# Patient Record
Sex: Male | Born: 1960 | Race: White | Hispanic: No | Marital: Married | Smoking: Never smoker
Health system: Southern US, Community
[De-identification: ages and names within clinical notes are randomized; demographics above are authoritative.]

## PROBLEM LIST (undated history)

## (undated) DIAGNOSIS — I639 Cerebral infarction, unspecified: Secondary | ICD-10-CM

## (undated) HISTORY — PX: CORONARY ARTERY BYPASS GRAFT: SHX141

---

## 2010-01-02 ENCOUNTER — Ambulatory Visit: Payer: Self-pay | Admitting: Occupational Medicine

## 2010-01-02 DIAGNOSIS — J309 Allergic rhinitis, unspecified: Secondary | ICD-10-CM | POA: Insufficient documentation

## 2010-01-02 DIAGNOSIS — J45909 Unspecified asthma, uncomplicated: Secondary | ICD-10-CM | POA: Insufficient documentation

## 2010-12-18 NOTE — Assessment & Plan Note (Signed)
Summary: Sinus pressure x 6 week s rm 2   Vital Signs:  Patient Profile:   50 Years Old Male CC:      Cold & URI symptoms Height:     68 inches Weight:      178 pounds O2 Sat:      97 % O2 treatment:    Room Air Temp:     96.7 degrees F oral Pulse rate:   59 / minute Pulse rhythm:   regular Resp:     16 per minute BP sitting:   165 / 111  (right arm) Cuff size:   regular  Vitals Entered By: Areta Haber CMA (January 02, 2010 7:25 PM)                  Current Allergies: No known allergies History of Present Illness Chief Complaint: Cold & URI symptoms History of Present Illness: Presents with complaints of sinus congestion for the last 6 weeks.   He gets increased facial pressure when bending over.   No complaints of teeth hurting.  No cough.    History of allergies and asthma.     No reports of fever.   He takes zyrtec as needed.   He has not taken any lately.   No nasal steroids.   No itchy eyes.  Some sneezing.     Afrin nasal spray at night helps.     Current Problems: ASTHMA (ICD-493.90) ALLERGIC RHINITIS (ICD-477.9) ALLERGIC RHINITIS (ICD-477.9)   Current Meds ADVAIR HFA 45-21 MCG/ACT AERO (FLUTICASONE-SALMETEROL) 1 puff two times a day PROVENTIL HFA 108 (90 BASE) MCG/ACT AERS (ALBUTEROL SULFATE) as needed AFRIN NASAL SPRAY 0.05 % SOLN (OXYMETAZOLINE HCL) As needed ZYRTEC HIVES RELIEF 10 MG TABS (CETIRIZINE HCL) as directed FLUTICASONE PROPIONATE 50 MCG/ACT SUSP (FLUTICASONE PROPIONATE) 2 sprays in each nostril once daily  REVIEW OF SYSTEMS Constitutional Symptoms      Denies fever, chills, night sweats, weight loss, weight gain, and fatigue.  Eyes       Denies change in vision, eye pain, eye discharge, glasses, contact lenses, and eye surgery. Ear/Nose/Throat/Mouth       Complains of sinus problems.      Denies hearing loss/aids, change in hearing, ear pain, ear discharge, dizziness, frequent runny nose, frequent nose bleeds, sore throat, hoarseness, and  tooth pain or bleeding.      Comments: nasal congestion x 6weeks Respiratory       Denies dry cough, productive cough, wheezing, shortness of breath, asthma, bronchitis, and emphysema/COPD.  Cardiovascular       Denies murmurs, chest pain, and tires easily with exhertion.    Gastrointestinal       Denies stomach pain, nausea/vomiting, diarrhea, constipation, blood in bowel movements, and indigestion. Genitourniary       Denies painful urination, kidney stones, and loss of urinary control. Neurological       Denies paralysis, seizures, and fainting/blackouts. Musculoskeletal       Denies muscle pain, joint pain, joint stiffness, decreased range of motion, redness, swelling, muscle weakness, and gout.  Skin       Denies bruising, unusual mles/lumps or sores, and hair/skin or nail changes.  Psych       Denies mood changes, temper/anger issues, anxiety/stress, speech problems, depression, and sleep problems.  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  Family History: Parkinsons Heart Surgery Family History Liver disease  Social History: Married Never Smoked Alcohol use-no Drug use-no  Regular exercise-no Smoking Status:  never Does Patient Exercise:  no Drug Use:  no Physical Exam General appearance: well developed, well nourished, no acute distress Ears: normal, no lesions or deformities Nasal: swollen red turbinates with congestion Oral/Pharynx: tongue normal, posterior pharynx without erythema or exudate Neck: neck supple,  trachea midline, no masses Chest/Lungs: no rales, wheezes, or rhonchi bilateral, breath sounds equal without effort Heart: regular rate and  rhythm, no murmur Assessment New Problems: ASTHMA (ICD-493.90) ALLERGIC RHINITIS (ICD-477.9) ALLERGIC RHINITIS (ICD-477.9)   Plan New Medications/Changes: FLUTICASONE PROPIONATE 50 MCG/ACT SUSP (FLUTICASONE PROPIONATE) 2 sprays in each  nostril once daily  #One x 1, 01/02/2010, Kathrine Haddock MD  New Orders: New Patient Level III [phone] Planning Comments:   Flonase as directed Use claritin D or Zyrtedc D Over the counter as directed on the box regularly Neti pot saline nasal rinses Follow up with Dr. Cathey Endow or Linford Arnold if no improvement in 2 weeks.   The patient and/or caregiver has been counseled thoroughly with regard to medications prescribed including dosage, schedule, interactions, rationale for use, and possible side effects and they verbalize understanding.  Diagnoses and expected course of recovery discussed and will return if not improved as expected or if the condition worsens. Patient and/or caregiver verbalized understanding.  Prescriptions: FLUTICASONE PROPIONATE 50 MCG/ACT SUSP (FLUTICASONE PROPIONATE) 2 sprays in each nostril once daily  #One x 1   Entered and Authorized by:   Kathrine Haddock MD   Signed by:   Kathrine Haddock MD on 01/02/2010   Method used:   Print then Give to Patient   RxID:   [id number]

## 2011-07-10 ENCOUNTER — Inpatient Hospital Stay (INDEPENDENT_AMBULATORY_CARE_PROVIDER_SITE_OTHER)
Admission: RE | Admit: 2011-07-10 | Discharge: 2011-07-10 | Disposition: A | Discharge status: Home / Self Care | Payer: BC Managed Care – PPO | Source: Ambulatory Visit | Attending: Family Medicine | Admitting: Family Medicine

## 2011-07-10 ENCOUNTER — Encounter: Payer: Self-pay | Admitting: Family Medicine

## 2011-07-10 DIAGNOSIS — J45909 Unspecified asthma, uncomplicated: Secondary | ICD-10-CM

## 2011-07-10 DIAGNOSIS — J309 Allergic rhinitis, unspecified: Secondary | ICD-10-CM

## 2011-07-10 DIAGNOSIS — J050 Acute obstructive laryngitis [croup]: Secondary | ICD-10-CM | POA: Insufficient documentation

## 2011-07-10 DIAGNOSIS — J069 Acute upper respiratory infection, unspecified: Secondary | ICD-10-CM

## 2011-07-14 ENCOUNTER — Telehealth (INDEPENDENT_AMBULATORY_CARE_PROVIDER_SITE_OTHER): Payer: Self-pay

## 2011-09-16 DIAGNOSIS — I2510 Atherosclerotic heart disease of native coronary artery without angina pectoris: Secondary | ICD-10-CM

## 2011-10-21 NOTE — Progress Notes (Signed)
Summary: COUGH...WSE(RM4)   Vital Signs:  Patient Profile:   50 Years Old Male CC:      Cough Height:     68 inches Weight:      169 pounds O2 Sat:      99 % O2 treatment:    Room Air Temp:     98.8 degrees F oral Pulse rate:   62 / minute Resp:     18 per minute BP sitting:   159 / 89  (left arm) Cuff size:   regular  Vitals Entered By: Linton Flemings RN (July 10, 2011 7:45 PM)                  Prior Medication List:  ADVAIR HFA 45-21 MCG/ACT AERO (FLUTICASONE-SALMETEROL) 1 puff two times a day PROVENTIL HFA 108 (90 BASE) MCG/ACT AERS (ALBUTEROL SULFATE) as needed AFRIN NASAL SPRAY 0.05 % SOLN (OXYMETAZOLINE HCL) As needed ZYRTEC HIVES RELIEF 10 MG TABS (CETIRIZINE HCL) as directed FLUTICASONE PROPIONATE 50 MCG/ACT SUSP (FLUTICASONE PROPIONATE) 2 sprays in each nostril once daily   Updated Prior Medication List: ADVAIR HFA 45-21 MCG/ACT AERO (FLUTICASONE-SALMETEROL) 1 puff two times a day PROVENTIL HFA 108 (90 BASE) MCG/ACT AERS (ALBUTEROL SULFATE) as needed  Current Allergies (reviewed today): ! * HAY FEVERHistory of Present Illness Chief Complaint: Cough History of Present Illness: Patient reports irritation of his throat for 2 weeks. He reports being horse and having a recurrsent ticle that sometimes seems if his throat was going to close over. he reports using his advair discus as prescribed twice aday and has used his rescue inhaler a couple of times w/out improvement. He does not smoke,is not exoposed to 2nd hand smoke and rarely drinks at all.  Current Problems: UPPER RESPIRATORY INFECTION, ACUTE (ICD-465.9) LARYNGOTRACHEOBRONCHITIS, ACUTE (ICD-464.4) ASTHMA (ICD-493.90) ALLERGIC RHINITIS (ICD-477.9)   Current Meds ADVAIR HFA 45-21 MCG/ACT AERO (FLUTICASONE-SALMETEROL) 1 puff two times a day PROVENTIL HFA 108 (90 BASE) MCG/ACT AERS (ALBUTEROL SULFATE) as needed ZITHROMAX Z-PAK 250 MG  TABS (AZITHROMYCIN) Use as directed TUSSIONEX PENNKINETIC ER 8-10  MG/5ML LQCR (CHLORPHENIRAMINE-HYDROCODONE) 1/2 to 1 tsp by mouth twice aday  REVIEW OF SYSTEMS Constitutional Symptoms      Denies fever, chills, night sweats, weight loss, weight gain, and fatigue.  Eyes       Complains of glasses.      Denies change in vision, eye pain, eye discharge, contact lenses, and eye surgery. Ear/Nose/Throat/Mouth       Complains of ear pain and hoarseness.      Denies hearing loss/aids, change in hearing, ear discharge, dizziness, frequent runny nose, frequent nose bleeds, sinus problems, sore throat, and tooth pain or bleeding.  Respiratory       Complains of dry cough and asthma.      Denies productive cough, wheezing, shortness of breath, bronchitis, and emphysema/COPD.  Cardiovascular       Denies murmurs, chest pain, and tires easily with exhertion.    Gastrointestinal       Denies stomach pain, nausea/vomiting, diarrhea, constipation, blood in bowel movements, and indigestion. Genitourniary       Denies painful urination, kidney stones, and loss of urinary control. Neurological       Denies paralysis, seizures, and fainting/blackouts. Musculoskeletal       Denies muscle pain, joint pain, joint stiffness, decreased range of motion, redness, swelling, muscle weakness, and gout.  Skin       Denies bruising, unusual mles/lumps or sores, and hair/skin or nail changes.  Psych  Denies mood changes, temper/anger issues, anxiety/stress, speech problems, depression, and sleep problems. Other Comments: X 2 WEEKS DRY COUGH ,STATES HE FEELS A TICKLE IN THE BACK OF  THE THROAT   Past History:  Family History: Last updated: 01/02/2010 Parkinsons Heart Surgery Family History Liver disease  Social History: Last updated: 01/02/2010 Married Never Smoked Alcohol use-no Drug use-no Regular exercise-no  Risk Factors: Exercise: no (01/02/2010)  Risk Factors: Smoking Status: never (01/02/2010)  Past Medical History: Reviewed history from 01/02/2010  and no changes required. Allergic rhinitis Asthma  Past Surgical History: Reviewed history from 01/02/2010 and no changes required. 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 Parkinsons Heart Surgery Family History Liver disease  Social History: Reviewed history from 01/02/2010 and no changes required. Married Never Smoked Alcohol use-no Drug use-no Regular exercise-no Physical Exam General appearance: well developed, well nourished, no acute distress Head: normocephalic, atraumatic Ears: normal, no lesions or deformities Oral/Pharynx: tongue normal, posterior pharynx without erythema or exudate Neck: supple,anterior lymphadenopathy present Chest/Lungs: no rales, wheezes, or rhonchi bilateral, breath sounds equal without effort Heart: regular rate and  rhythm, no murmur Skin: no obvious rashes or lesions MSE: oriented to time, place, and person Assessment New Problems: UPPER RESPIRATORY INFECTION, ACUTE (ICD-465.9) LARYNGOTRACHEOBRONCHITIS, ACUTE (ICD-464.4)   Patient Education: Patient and/or caregiver instructed in the following: rest fluids and Tylenol.  Plan New Medications/Changes: Sandria Senter ER 8-10 MG/5ML LQCR (CHLORPHENIRAMINE-HYDROCODONE) 1/2 to 1 tsp by mouth twice aday  #40fl oz x 0, 07/10/2011, Hassan Rowan MD ZITHROMAX Z-PAK 250 MG  TABS (AZITHROMYCIN) Use as directed  #1 x 0, 07/10/2011, Hassan Rowan MD  New Orders: New Patient Level III [phone] Planning Comments:   stress the importance of follow up w/ENT for possible indirect laryngoscopy.  Considered getting a CXR today butX-ray technician left already.  Follow Up: Follow up in 2-3 days if no improvement, Follow up on an as needed basis, Follow up with Primary Physician  The patient and/or caregiver has been counseled thoroughly with regard to medications prescribed including dosage, schedule, interactions, rationale for use, and  possible side effects and they verbalize understanding.  Diagnoses and expected course of recovery discussed and will return if not improved as expected or if the condition worsens. Patient and/or caregiver verbalized understanding.  Prescriptions: Sandria Senter ER 8-10 MG/5ML LQCR (CHLORPHENIRAMINE-HYDROCODONE) 1/2 to 1 tsp by mouth twice aday  #83fl oz x 0   Entered and Authorized by:   Hassan Rowan MD   Signed by:   Hassan Rowan MD on 07/10/2011   Method used:   Printed then faxed to ...       [facility]  [address] (retail)       [address]       [place], [place]  [address]       Ph: [phone]       Fax: [phone]   RxID:   [id number] ZITHROMAX Z-PAK 250 MG  TABS (AZITHROMYCIN) Use as directed  #1 x 0   Entered and Authorized by:   Hassan Rowan MD   Signed by:   Hassan Rowan MD on 07/10/2011   Method used:   Printed then faxed to ...       [facility]  [address] (retail)       [address]       [place], [place]  [address]       Ph: [phone]  Fax: [phone]   RxID:   [id number]   Patient Instructions: 1)  Please schedule a follow-up appointment as needed. 2)  Please schedule an appointment with your primary doctor in :7 -10 days or ENT if not completetly resolved 3)  May need CXR or ENT follow up 4)  Take your antibiotic as prescribed until ALL of it is gone, but stop if you develop a rash or swelling and contact our office as soon as possible.  Orders Added: 1)  New Patient Level III [30865]

## 2011-10-21 NOTE — Telephone Encounter (Signed)
  Phone Note Outgoing Call   Call placed by: Linton Flemings RN,  July 14, 2011 4:30 PM Call placed to: Patient Summary of Call: F/U CALL, NO ANSWER. MSG LEFT TO CALL FACILITY WITH QUESTIONS/CONCERNS

## 2019-11-04 DIAGNOSIS — J0110 Acute frontal sinusitis, unspecified: Secondary | ICD-10-CM | POA: Diagnosis not present

## 2020-07-30 DIAGNOSIS — Z20822 Contact with and (suspected) exposure to covid-19: Secondary | ICD-10-CM | POA: Diagnosis not present

## 2020-07-30 DIAGNOSIS — J45909 Unspecified asthma, uncomplicated: Secondary | ICD-10-CM | POA: Diagnosis not present

## 2021-12-20 DIAGNOSIS — I252 Old myocardial infarction: Secondary | ICD-10-CM | POA: Diagnosis not present

## 2021-12-20 DIAGNOSIS — Z91199 Patient's noncompliance with other medical treatment and regimen due to unspecified reason: Secondary | ICD-10-CM | POA: Diagnosis not present

## 2021-12-20 DIAGNOSIS — I2109 ST elevation (STEMI) myocardial infarction involving other coronary artery of anterior wall: Secondary | ICD-10-CM | POA: Diagnosis not present

## 2021-12-20 DIAGNOSIS — I10 Essential (primary) hypertension: Secondary | ICD-10-CM | POA: Diagnosis not present

## 2021-12-20 DIAGNOSIS — I498 Other specified cardiac arrhythmias: Secondary | ICD-10-CM | POA: Diagnosis not present

## 2021-12-20 DIAGNOSIS — Z955 Presence of coronary angioplasty implant and graft: Secondary | ICD-10-CM | POA: Diagnosis not present

## 2021-12-20 DIAGNOSIS — I358 Other nonrheumatic aortic valve disorders: Secondary | ICD-10-CM | POA: Diagnosis not present

## 2021-12-20 DIAGNOSIS — I2583 Coronary atherosclerosis due to lipid rich plaque: Secondary | ICD-10-CM | POA: Diagnosis not present

## 2021-12-20 DIAGNOSIS — T82855A Stenosis of coronary artery stent, initial encounter: Secondary | ICD-10-CM | POA: Diagnosis not present

## 2021-12-20 DIAGNOSIS — Z79899 Other long term (current) drug therapy: Secondary | ICD-10-CM | POA: Diagnosis not present

## 2021-12-20 DIAGNOSIS — I959 Hypotension, unspecified: Secondary | ICD-10-CM | POA: Diagnosis not present

## 2021-12-20 DIAGNOSIS — I2510 Atherosclerotic heart disease of native coronary artery without angina pectoris: Secondary | ICD-10-CM | POA: Diagnosis not present

## 2021-12-20 DIAGNOSIS — R001 Bradycardia, unspecified: Secondary | ICD-10-CM | POA: Diagnosis not present

## 2021-12-20 DIAGNOSIS — I213 ST elevation (STEMI) myocardial infarction of unspecified site: Secondary | ICD-10-CM | POA: Diagnosis not present

## 2021-12-20 DIAGNOSIS — Z95818 Presence of other cardiac implants and grafts: Secondary | ICD-10-CM | POA: Diagnosis not present

## 2021-12-20 DIAGNOSIS — R9431 Abnormal electrocardiogram [ECG] [EKG]: Secondary | ICD-10-CM | POA: Diagnosis not present

## 2021-12-20 DIAGNOSIS — T82867A Thrombosis of cardiac prosthetic devices, implants and grafts, initial encounter: Secondary | ICD-10-CM | POA: Diagnosis not present

## 2021-12-20 DIAGNOSIS — E785 Hyperlipidemia, unspecified: Secondary | ICD-10-CM | POA: Diagnosis not present

## 2021-12-20 DIAGNOSIS — Z7982 Long term (current) use of aspirin: Secondary | ICD-10-CM | POA: Diagnosis not present

## 2021-12-20 DIAGNOSIS — I2584 Coronary atherosclerosis due to calcified coronary lesion: Secondary | ICD-10-CM | POA: Diagnosis not present

## 2021-12-20 DIAGNOSIS — I493 Ventricular premature depolarization: Secondary | ICD-10-CM | POA: Diagnosis not present

## 2021-12-20 DIAGNOSIS — Z9114 Patient's other noncompliance with medication regimen: Secondary | ICD-10-CM | POA: Diagnosis not present

## 2022-01-16 DIAGNOSIS — B349 Viral infection, unspecified: Secondary | ICD-10-CM | POA: Diagnosis not present

## 2022-02-04 DIAGNOSIS — I2510 Atherosclerotic heart disease of native coronary artery without angina pectoris: Secondary | ICD-10-CM | POA: Diagnosis not present

## 2022-02-04 DIAGNOSIS — I252 Old myocardial infarction: Secondary | ICD-10-CM | POA: Diagnosis not present

## 2022-02-04 DIAGNOSIS — E782 Mixed hyperlipidemia: Secondary | ICD-10-CM | POA: Diagnosis not present

## 2022-02-04 DIAGNOSIS — I10 Essential (primary) hypertension: Secondary | ICD-10-CM | POA: Diagnosis not present

## 2022-02-04 DIAGNOSIS — I255 Ischemic cardiomyopathy: Secondary | ICD-10-CM | POA: Diagnosis not present

## 2022-02-06 DIAGNOSIS — I2510 Atherosclerotic heart disease of native coronary artery without angina pectoris: Secondary | ICD-10-CM | POA: Diagnosis not present

## 2022-03-04 DIAGNOSIS — Z781 Physical restraint status: Secondary | ICD-10-CM | POA: Diagnosis not present

## 2022-03-04 DIAGNOSIS — J449 Chronic obstructive pulmonary disease, unspecified: Secondary | ICD-10-CM | POA: Diagnosis not present

## 2022-03-04 DIAGNOSIS — I6389 Other cerebral infarction: Secondary | ICD-10-CM | POA: Diagnosis not present

## 2022-03-04 DIAGNOSIS — Z95818 Presence of other cardiac implants and grafts: Secondary | ICD-10-CM | POA: Diagnosis not present

## 2022-03-04 DIAGNOSIS — J45909 Unspecified asthma, uncomplicated: Secondary | ICD-10-CM | POA: Diagnosis not present

## 2022-03-04 DIAGNOSIS — G8918 Other acute postprocedural pain: Secondary | ICD-10-CM | POA: Diagnosis not present

## 2022-03-04 DIAGNOSIS — J95811 Postprocedural pneumothorax: Secondary | ICD-10-CM | POA: Diagnosis not present

## 2022-03-04 DIAGNOSIS — I249 Acute ischemic heart disease, unspecified: Secondary | ICD-10-CM | POA: Diagnosis not present

## 2022-03-04 DIAGNOSIS — I639 Cerebral infarction, unspecified: Secondary | ICD-10-CM | POA: Diagnosis not present

## 2022-03-04 DIAGNOSIS — I491 Atrial premature depolarization: Secondary | ICD-10-CM | POA: Diagnosis not present

## 2022-03-04 DIAGNOSIS — Z9911 Dependence on respirator [ventilator] status: Secondary | ICD-10-CM | POA: Diagnosis not present

## 2022-03-04 DIAGNOSIS — Z01810 Encounter for preprocedural cardiovascular examination: Secondary | ICD-10-CM | POA: Diagnosis not present

## 2022-03-04 DIAGNOSIS — R0989 Other specified symptoms and signs involving the circulatory and respiratory systems: Secondary | ICD-10-CM | POA: Diagnosis not present

## 2022-03-04 DIAGNOSIS — J9621 Acute and chronic respiratory failure with hypoxia: Secondary | ICD-10-CM | POA: Diagnosis not present

## 2022-03-04 DIAGNOSIS — R590 Localized enlarged lymph nodes: Secondary | ICD-10-CM | POA: Diagnosis not present

## 2022-03-04 DIAGNOSIS — R69 Illness, unspecified: Secondary | ICD-10-CM | POA: Diagnosis not present

## 2022-03-04 DIAGNOSIS — Z9889 Other specified postprocedural states: Secondary | ICD-10-CM | POA: Diagnosis not present

## 2022-03-04 DIAGNOSIS — I25119 Atherosclerotic heart disease of native coronary artery with unspecified angina pectoris: Secondary | ICD-10-CM | POA: Diagnosis not present

## 2022-03-04 DIAGNOSIS — I25118 Atherosclerotic heart disease of native coronary artery with other forms of angina pectoris: Secondary | ICD-10-CM | POA: Diagnosis not present

## 2022-03-04 DIAGNOSIS — E782 Mixed hyperlipidemia: Secondary | ICD-10-CM | POA: Diagnosis not present

## 2022-03-04 DIAGNOSIS — I10 Essential (primary) hypertension: Secondary | ICD-10-CM | POA: Diagnosis not present

## 2022-03-04 DIAGNOSIS — Z955 Presence of coronary angioplasty implant and graft: Secondary | ICD-10-CM | POA: Diagnosis not present

## 2022-03-04 DIAGNOSIS — D62 Acute posthemorrhagic anemia: Secondary | ICD-10-CM | POA: Diagnosis not present

## 2022-03-04 DIAGNOSIS — J939 Pneumothorax, unspecified: Secondary | ICD-10-CM | POA: Diagnosis not present

## 2022-03-04 DIAGNOSIS — Z7902 Long term (current) use of antithrombotics/antiplatelets: Secondary | ICD-10-CM | POA: Diagnosis not present

## 2022-03-04 DIAGNOSIS — I252 Old myocardial infarction: Secondary | ICD-10-CM | POA: Diagnosis not present

## 2022-03-04 DIAGNOSIS — I214 Non-ST elevation (NSTEMI) myocardial infarction: Secondary | ICD-10-CM | POA: Diagnosis not present

## 2022-03-04 DIAGNOSIS — J159 Unspecified bacterial pneumonia: Secondary | ICD-10-CM | POA: Diagnosis not present

## 2022-03-04 DIAGNOSIS — R079 Chest pain, unspecified: Secondary | ICD-10-CM | POA: Diagnosis not present

## 2022-03-04 DIAGNOSIS — I498 Other specified cardiac arrhythmias: Secondary | ICD-10-CM | POA: Diagnosis not present

## 2022-03-04 DIAGNOSIS — I499 Cardiac arrhythmia, unspecified: Secondary | ICD-10-CM | POA: Diagnosis not present

## 2022-03-04 DIAGNOSIS — R451 Restlessness and agitation: Secondary | ICD-10-CM | POA: Diagnosis not present

## 2022-03-04 DIAGNOSIS — J9811 Atelectasis: Secondary | ICD-10-CM | POA: Diagnosis not present

## 2022-03-04 DIAGNOSIS — Z4659 Encounter for fitting and adjustment of other gastrointestinal appliance and device: Secondary | ICD-10-CM | POA: Diagnosis not present

## 2022-03-04 DIAGNOSIS — I2510 Atherosclerotic heart disease of native coronary artery without angina pectoris: Secondary | ICD-10-CM | POA: Diagnosis not present

## 2022-03-04 DIAGNOSIS — I6521 Occlusion and stenosis of right carotid artery: Secondary | ICD-10-CM | POA: Diagnosis not present

## 2022-03-04 DIAGNOSIS — I6522 Occlusion and stenosis of left carotid artery: Secondary | ICD-10-CM | POA: Diagnosis not present

## 2022-03-04 DIAGNOSIS — Z7982 Long term (current) use of aspirin: Secondary | ICD-10-CM | POA: Diagnosis not present

## 2022-03-04 DIAGNOSIS — E119 Type 2 diabetes mellitus without complications: Secondary | ICD-10-CM | POA: Diagnosis not present

## 2022-03-04 DIAGNOSIS — I2129 ST elevation (STEMI) myocardial infarction involving other sites: Secondary | ICD-10-CM | POA: Diagnosis not present

## 2022-03-04 DIAGNOSIS — B9689 Other specified bacterial agents as the cause of diseases classified elsewhere: Secondary | ICD-10-CM | POA: Diagnosis not present

## 2022-03-04 DIAGNOSIS — J9600 Acute respiratory failure, unspecified whether with hypoxia or hypercapnia: Secondary | ICD-10-CM | POA: Diagnosis not present

## 2022-03-04 DIAGNOSIS — I25110 Atherosclerotic heart disease of native coronary artery with unstable angina pectoris: Secondary | ICD-10-CM | POA: Diagnosis not present

## 2022-03-04 DIAGNOSIS — R778 Other specified abnormalities of plasma proteins: Secondary | ICD-10-CM | POA: Diagnosis not present

## 2022-03-04 DIAGNOSIS — R918 Other nonspecific abnormal finding of lung field: Secondary | ICD-10-CM | POA: Diagnosis not present

## 2022-03-04 DIAGNOSIS — I669 Occlusion and stenosis of unspecified cerebral artery: Secondary | ICD-10-CM | POA: Diagnosis not present

## 2022-03-04 DIAGNOSIS — R55 Syncope and collapse: Secondary | ICD-10-CM | POA: Diagnosis not present

## 2022-03-04 DIAGNOSIS — R9431 Abnormal electrocardiogram [ECG] [EKG]: Secondary | ICD-10-CM | POA: Diagnosis not present

## 2022-03-04 DIAGNOSIS — Z8673 Personal history of transient ischemic attack (TIA), and cerebral infarction without residual deficits: Secondary | ICD-10-CM | POA: Diagnosis not present

## 2022-03-04 DIAGNOSIS — R2990 Unspecified symptoms and signs involving the nervous system: Secondary | ICD-10-CM | POA: Diagnosis not present

## 2022-03-04 DIAGNOSIS — Z9861 Coronary angioplasty status: Secondary | ICD-10-CM | POA: Diagnosis not present

## 2022-03-04 DIAGNOSIS — Z4682 Encounter for fitting and adjustment of non-vascular catheter: Secondary | ICD-10-CM | POA: Diagnosis not present

## 2022-03-04 DIAGNOSIS — R001 Bradycardia, unspecified: Secondary | ICD-10-CM | POA: Diagnosis not present

## 2022-03-04 DIAGNOSIS — Z951 Presence of aortocoronary bypass graft: Secondary | ICD-10-CM | POA: Diagnosis not present

## 2022-03-04 DIAGNOSIS — G8191 Hemiplegia, unspecified affecting right dominant side: Secondary | ICD-10-CM | POA: Diagnosis not present

## 2022-03-04 DIAGNOSIS — J122 Parainfluenza virus pneumonia: Secondary | ICD-10-CM | POA: Diagnosis not present

## 2022-03-04 DIAGNOSIS — J189 Pneumonia, unspecified organism: Secondary | ICD-10-CM | POA: Diagnosis not present

## 2022-03-04 DIAGNOSIS — R059 Cough, unspecified: Secondary | ICD-10-CM | POA: Diagnosis not present

## 2022-03-04 DIAGNOSIS — E785 Hyperlipidemia, unspecified: Secondary | ICD-10-CM | POA: Diagnosis not present

## 2022-03-04 DIAGNOSIS — I255 Ischemic cardiomyopathy: Secondary | ICD-10-CM | POA: Diagnosis not present

## 2022-03-04 DIAGNOSIS — I6523 Occlusion and stenosis of bilateral carotid arteries: Secondary | ICD-10-CM | POA: Diagnosis not present

## 2022-03-04 DIAGNOSIS — I63232 Cerebral infarction due to unspecified occlusion or stenosis of left carotid arteries: Secondary | ICD-10-CM | POA: Diagnosis not present

## 2022-03-14 ENCOUNTER — Inpatient Hospital Stay
Admission: AD | Admit: 2022-03-14 | Discharge: 2022-04-16 | Disposition: A | Discharge status: Rehabilitation Facility | Payer: No Typology Code available for payment source | Source: Other Acute Inpatient Hospital

## 2022-03-14 ENCOUNTER — Other Ambulatory Visit (HOSPITAL_COMMUNITY): Payer: No Typology Code available for payment source

## 2022-03-14 DIAGNOSIS — J9621 Acute and chronic respiratory failure with hypoxia: Secondary | ICD-10-CM

## 2022-03-14 DIAGNOSIS — J159 Unspecified bacterial pneumonia: Secondary | ICD-10-CM

## 2022-03-14 DIAGNOSIS — I2510 Atherosclerotic heart disease of native coronary artery without angina pectoris: Secondary | ICD-10-CM

## 2022-03-14 DIAGNOSIS — J449 Chronic obstructive pulmonary disease, unspecified: Secondary | ICD-10-CM

## 2022-03-14 DIAGNOSIS — I639 Cerebral infarction, unspecified: Secondary | ICD-10-CM

## 2022-03-15 DIAGNOSIS — I639 Cerebral infarction, unspecified: Secondary | ICD-10-CM | POA: Diagnosis not present

## 2022-03-15 DIAGNOSIS — J159 Unspecified bacterial pneumonia: Secondary | ICD-10-CM

## 2022-03-15 DIAGNOSIS — J449 Chronic obstructive pulmonary disease, unspecified: Secondary | ICD-10-CM | POA: Diagnosis not present

## 2022-03-15 DIAGNOSIS — I2510 Atherosclerotic heart disease of native coronary artery without angina pectoris: Secondary | ICD-10-CM | POA: Diagnosis not present

## 2022-03-15 DIAGNOSIS — J9621 Acute and chronic respiratory failure with hypoxia: Secondary | ICD-10-CM | POA: Diagnosis not present

## 2022-03-15 DIAGNOSIS — J9600 Acute respiratory failure, unspecified whether with hypoxia or hypercapnia: Secondary | ICD-10-CM | POA: Diagnosis not present

## 2022-03-15 DIAGNOSIS — G9340 Encephalopathy, unspecified: Secondary | ICD-10-CM | POA: Diagnosis not present

## 2022-03-15 DIAGNOSIS — R1310 Dysphagia, unspecified: Secondary | ICD-10-CM | POA: Diagnosis not present

## 2022-03-15 DIAGNOSIS — I10 Essential (primary) hypertension: Secondary | ICD-10-CM | POA: Diagnosis not present

## 2022-03-15 LAB — COMPREHENSIVE METABOLIC PANEL
ALT: 95 U/L — ABNORMAL HIGH (ref 0–44)
AST: 74 U/L — ABNORMAL HIGH (ref 15–41)
Albumin: 2.2 g/dL — ABNORMAL LOW (ref 3.5–5.0)
Alkaline Phosphatase: 52 U/L (ref 38–126)
Anion gap: 7 (ref 5–15)
BUN: 29 mg/dL — ABNORMAL HIGH (ref 6–20)
CO2: 25 mmol/L (ref 22–32)
Calcium: 7.9 mg/dL — ABNORMAL LOW (ref 8.9–10.3)
Chloride: 103 mmol/L (ref 98–111)
Creatinine, Ser: 0.74 mg/dL (ref 0.61–1.24)
GFR, Estimated: >60 mL/min (ref >60)
Glucose, Bld: 128 mg/dL — ABNORMAL HIGH (ref 70–99)
Potassium: 4.7 mmol/L (ref 3.5–5.1)
Sodium: 135 mmol/L (ref 135–145)
Total Bilirubin: 0.6 mg/dL (ref 0.3–1.2)
Total Protein: 5.2 g/dL — ABNORMAL LOW (ref 6.5–8.1)

## 2022-03-15 LAB — CBC WITH DIFFERENTIAL/PLATELET
Abs Immature Granulocytes: 0.84 10*3/uL — ABNORMAL HIGH (ref 0.00–0.07)
Basophils Absolute: 0.1 10*3/uL (ref 0.0–0.1)
Basophils Relative: 1 %
Eosinophils Absolute: 0.5 10*3/uL (ref 0.0–0.5)
Eosinophils Relative: 3 %
HCT: 29.4 % — ABNORMAL LOW (ref 39.0–52.0)
Hemoglobin: 9.7 g/dL — ABNORMAL LOW (ref 13.0–17.0)
Immature Granulocytes: 5 %
Lymphocytes Relative: 8 %
Lymphs Abs: 1.4 10*3/uL (ref 0.7–4.0)
MCH: 31.4 pg (ref 26.0–34.0)
MCHC: 33 g/dL (ref 30.0–36.0)
MCV: 95.1 fL (ref 80.0–100.0)
Monocytes Absolute: 1.5 10*3/uL — ABNORMAL HIGH (ref 0.1–1.0)
Monocytes Relative: 9 %
Neutro Abs: 12.6 10*3/uL — ABNORMAL HIGH (ref 1.7–7.7)
Neutrophils Relative %: 74 %
Platelets: 409 10*3/uL — ABNORMAL HIGH (ref 150–400)
RBC: 3.09 MIL/uL — ABNORMAL LOW (ref 4.22–5.81)
RDW: 12.9 % (ref 11.5–15.5)
WBC: 17 10*3/uL — ABNORMAL HIGH (ref 4.0–10.5)
nRBC: 0.2 % (ref 0.0–0.2)

## 2022-03-15 NOTE — Consult Note (Signed)
Pulmonary Critical Care Medicine ?[facility] [place]  ?PULMONARY SERVICE ? ?Date of Service: 03/15/2022 ? ?PULMONARY CRITICAL CARE CONSULT ? ? ?Dellis Anes  ?HCW:[id number]  ?DOB: [birth year]  ? ?DOA: 03/14/2022 ? ?Referring Physician: Luna Kitchens, MD ? ?HPI: Patrick Dunn is a 61 y.o. male seen for follow up of Acute on Chronic Respiratory Failure.  Patient has multiple medical problems including hypertension hyperlipidemia asthma STEMI came into the hospital because of chest pain.  Patient had a cardiac catheterization done which showed irregular changes on the left mainstem and also LAD occlusion.  The patient had a stent multiple other occlusions.  Cardiac surgery was consulted and patient was transferred to [facility] for surgical intervention.  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  There was felt to be an ischemic changes are needed.  Had an echocardiogram showing a low ejection fraction patient had been extubated on 421 but was reintubated because of poor gag reflex and cough issues.  It was felt the patient was not able to clear secretions at that time.  Subsequently patient had a tracheostomy done transferred to our facility for further management currently patient is on T collar on 28% FiO2 has remained nonverbal basically ? ?Review of Systems:  ?ROS performed and is unremarkable other than noted above. ? ?Diagnosis Date  ? Coronary artery disease  ? Hyperlipidemia  ? MI (myocardial infarction) (HCC)  ? NSTEMI (non-ST elevated myocardial infarction) (HCC)  ? ?No past surgical history on file.  ?No family history on file.  ?Social History  ? ?Socioeconomic History  ? Marital status: Married  ?Spouse name: Not on file  ? Number of children: Not on file  ? Years of education: Not on file  ? Highest education level: Not on file  ?Occupational History  ? Not on file  ?Tobacco Use  ? Smoking status: Never  ? Smokeless  tobacco: Never  ?Vaping Use  ? Vaping Use: Never used  ?Substance and Sexual Activity  ? Alcohol use: Not Currently  ? ?Medications: ?Reviewed on Rounds ? ?Physical Exam: ? ?Vitals: Temperature 99.0 pulse 75 respiratory 17 blood pressure 127/67 saturations 99% ? ?Ventilator Settings T collar FiO2 28% ? ?General: Comfortable at this time ?Eyes: Grossly normal lids, irises & conjunctiva ?ENT: grossly tongue is normal ?Neck: no obvious mass ?Cardiovascular: S1-S2 normal ?Respiratory: Coarse rhonchi ?Abdomen: Soft and nontender ?Skin: no rash seen on limited exam ?Musculoskeletal: not rigid ?Psychiatric:unable to assess ?Neurologic: no seizure no involuntary movements   ?   ?   ?Labs on Admission:  ?Basic Metabolic Panel: ?Recent Labs  ?Lab 03/15/22 ?1761  ?NA 135  ?K 4.7  ?CL 103  ?CO2 25  ?GLUCOSE 128*  ?BUN 29*  ?CREATININE 0.74  ?CALCIUM 7.9*  ? ? ?No results for input(s): PHART, PCO2ART, PO2ART, HCO3, O2SAT in the last 168 hours. ? ?Liver Function Tests: ?Recent Labs  ?Lab 03/15/22 ?6073  ?AST 74*  ?ALT 95*  ?ALKPHOS 52  ?BILITOT 0.6  ?PROT 5.2*  ?ALBUMIN 2.2*  ? ?No results for input(s): LIPASE, AMYLASE in the last 168 hours. ?No results for input(s): AMMONIA in the last 168 hours. ? ?CBC: ?Recent Labs  ?Lab 03/15/22 ?7106  ?WBC 17.0*  ?NEUTROABS 12.6*  ?HGB 9.7*  ?HCT 29.4*  ?MCV 95.1  ?PLT 409*  ? ? ?Cardiac Enzymes: ?No results for input(s): CKTOTAL, CKMB, CKMBINDEX, TROPONINI in the last 168 hours. ? ?BNP (last 3 results) ?No results  for input(s): BNP in the last 8760 hours. ? ?ProBNP (last 3 results) ?No results for input(s): PROBNP in the last 8760 hours. ? ? ?Radiological Exams on Admission: ?DG Abd 1 View ? ?Result Date: 03/14/2022 ?CLINICAL DATA:  Nasoenteric feeding tube placement EXAM: ABDOMEN - 1 VIEW COMPARISON:  None. FINDINGS: Nasoenteric feeding tube tip is seen within the expected location of the ligament of Treitz. Normal abdominal gas pattern. No gross free intraperitoneal gas. IMPRESSION:  Nasoenteric feeding tube tip at the level of the ligament of Treitz. Electronically Signed   By: Helyn Numbers M.D.   On: 03/14/2022 17:51  ? ?DG Chest Port 1 View ? ?Result Date: 03/14/2022 ?CLINICAL DATA:  Nasogastric tube placement EXAM: PORTABLE CHEST 1 VIEW COMPARISON:  None. FINDINGS: Lung volumes are small. Sparse bilateral upper lung zone pulmonary infiltrates have developed, possibly infectious in the appropriate clinical setting. No pneumothorax or pleural effusion. Tracheostomy is in position. Median sternotomy has been performed. Cardiac size is within normal limits. Pulmonary vascularity is normal. Nasoenteric feeding tube extends into the upper abdomen beyond the margin of the examination. IMPRESSION: Tracheostomy in expected position. Pulmonary hypoinflation. Interval median sternotomy. Development of biapical pulmonary infiltrates, possibly infectious in the appropriate clinical setting. Electronically Signed   By: Helyn Numbers M.D.   On: 03/14/2022 17:54   ? ?Assessment/Plan ?Active Problems: ?  Acute on chronic respiratory failure with hypoxia (HCC) ?  CAD (coronary artery disease), native coronary artery ?  Acute stroke due to ischemia [facility]) ?  Hospital-acquired bacterial pneumonia ?  Chronic obstructive pulmonary disease (HCC) ? ? ?Acute on chronic respiratory failure with hypoxia patient is on T collar currently on 28% FiO2 saturations are good plan is to titrate oxygen as tolerated we will continue with the weaning protocol and see if patient is able to tolerate PMV and capping ?Coronary artery disease status post CABG plan is going to be to continue to monitor no sign of any active chest pain at this time. ?Chronic obstructive asthma by history nebulizers as needed we will continue with supportive care and monitor closely. ?Acute stroke apparently patient suffered a stroke on April 19 has fairly severe intracranial carotid stenosis ?Hospital-acquired pneumonia patient has been treated for  infection remains at risk for aspiration ? ?I have personally seen and evaluated the patient, evaluated laboratory and imaging results, formulated the assessment and plan and placed orders. ?The Patient requires high complexity decision making with multiple systems involvement.  ?Case was discussed on Rounds with the Respiratory Therapy Director and the Respiratory staff ?Time Spent ? ?Yevonne Pax, MD FCCP ?Pulmonary Critical Care Medicine ?Sleep Medicine  ?

## 2022-03-16 DIAGNOSIS — J449 Chronic obstructive pulmonary disease, unspecified: Secondary | ICD-10-CM | POA: Diagnosis not present

## 2022-03-16 DIAGNOSIS — J159 Unspecified bacterial pneumonia: Secondary | ICD-10-CM

## 2022-03-16 DIAGNOSIS — J9621 Acute and chronic respiratory failure with hypoxia: Secondary | ICD-10-CM | POA: Diagnosis not present

## 2022-03-16 DIAGNOSIS — I639 Cerebral infarction, unspecified: Secondary | ICD-10-CM | POA: Diagnosis not present

## 2022-03-16 DIAGNOSIS — I2510 Atherosclerotic heart disease of native coronary artery without angina pectoris: Secondary | ICD-10-CM | POA: Diagnosis not present

## 2022-03-16 DIAGNOSIS — J690 Pneumonitis due to inhalation of food and vomit: Secondary | ICD-10-CM | POA: Diagnosis not present

## 2022-03-16 DIAGNOSIS — G9340 Encephalopathy, unspecified: Secondary | ICD-10-CM | POA: Diagnosis not present

## 2022-03-16 DIAGNOSIS — I10 Essential (primary) hypertension: Secondary | ICD-10-CM | POA: Diagnosis not present

## 2022-03-16 DIAGNOSIS — J9600 Acute respiratory failure, unspecified whether with hypoxia or hypercapnia: Secondary | ICD-10-CM | POA: Diagnosis not present

## 2022-03-16 NOTE — Progress Notes (Signed)
Pulmonary Critical Care Medicine ?[facility] [place] ?  ?PULMONARY CRITICAL CARE SERVICE ? ?PROGRESS NOTE ? ? ? ? ?Dellis Anes  ?OZH:[id number]  ?DOB: 16-May-1961  ? ?DOA: 03/14/2022 ? ?Referring Physician: Luna Kitchens, MD ? ?HPI: Patrick Dunn is a 61 y.o. male being followed for ventilator/airway/oxygen weaning Acute on Chronic Respiratory Failure.  Patient is on T collar at this time has been on 28% FiO2.  Tracheostomy has possibly removed the patient is actually doing well I did not detect any CO2 return from the trach the trach has been capped patient is in no distress could possibly be decannulated but the only concern would be whether he is able to handle secretions.  In addition anesthesia was asked to see the patient for possibility of reinserting the tracheostomy however it appears that it is currently not in position ? ?Medications: ?Reviewed on Rounds ? ?Physical Exam: ? ?Vitals: Temperature 98.9 pulse 79 respiratory rate is 23 blood pressure 117/67 saturations 96% ? ?Ventilator Settings now patient is capping ? ?General: Comfortable at this time ?Neck: supple ?Cardiovascular: no malignant arrhythmias ?Respiratory: No rhonchi or rales are noted ?Skin: no rash seen on limited exam ?Musculoskeletal: No gross abnormality ?Psychiatric:unable to assess ?Neurologic:no involuntary movements   ?   ?   ?Lab Data:  ? ?Basic Metabolic Panel: ?Recent Labs  ?Lab 03/15/22 ?6295  ?NA 135  ?K 4.7  ?CL 103  ?CO2 25  ?GLUCOSE 128*  ?BUN 29*  ?CREATININE 0.74  ?CALCIUM 7.9*  ? ? ?ABG: ?No results for input(s): PHART, PCO2ART, PO2ART, HCO3, O2SAT in the last 168 hours. ? ?Liver Function Tests: ?Recent Labs  ?Lab 03/15/22 ?2841  ?AST 74*  ?ALT 95*  ?ALKPHOS 52  ?BILITOT 0.6  ?PROT 5.2*  ?ALBUMIN 2.2*  ? ?No results for input(s): LIPASE, AMYLASE in the last 168 hours. ?No results for input(s): AMMONIA in the last 168 hours. ? ?CBC: ?Recent Labs  ?Lab 03/15/22 ?3244  ?WBC 17.0*  ?NEUTROABS 12.6*  ?HGB 9.7*  ?HCT 29.4*   ?MCV 95.1  ?PLT 409*  ? ? ?Cardiac Enzymes: ?No results for input(s): CKTOTAL, CKMB, CKMBINDEX, TROPONINI in the last 168 hours. ? ?BNP (last 3 results) ?No results for input(s): BNP in the last 8760 hours. ? ?ProBNP (last 3 results) ?No results for input(s): PROBNP in the last 8760 hours. ? ?Radiological Exams: ?DG Abd 1 View ? ?Result Date: 03/14/2022 ?CLINICAL DATA:  Nasoenteric feeding tube placement EXAM: ABDOMEN - 1 VIEW COMPARISON:  None. FINDINGS: Nasoenteric feeding tube tip is seen within the expected location of the ligament of Treitz. Normal abdominal gas pattern. No gross free intraperitoneal gas. IMPRESSION: Nasoenteric feeding tube tip at the level of the ligament of Treitz. Electronically Signed   By: Helyn Numbers M.D.   On: 03/14/2022 17:51  ? ?DG Chest Port 1 View ? ?Result Date: 03/14/2022 ?CLINICAL DATA:  Nasogastric tube placement EXAM: PORTABLE CHEST 1 VIEW COMPARISON:  None. FINDINGS: Lung volumes are small. Sparse bilateral upper lung zone pulmonary infiltrates have developed, possibly infectious in the appropriate clinical setting. No pneumothorax or pleural effusion. Tracheostomy is in position. Median sternotomy has been performed. Cardiac size is within normal limits. Pulmonary vascularity is normal. Nasoenteric feeding tube extends into the upper abdomen beyond the margin of the examination. IMPRESSION: Tracheostomy in expected position. Pulmonary hypoinflation. Interval median sternotomy. Development of biapical pulmonary infiltrates, possibly infectious in the appropriate clinical setting. Electronically Signed   By: Helyn Numbers M.D.   On: 03/14/2022 17:54   ? ?  Assessment/Plan ?Active Problems: ?  Acute on chronic respiratory failure with hypoxia (HCC) ?  CAD (coronary artery disease), native coronary artery ?  Acute stroke due to ischemia [facility]) ?  Hospital-acquired bacterial pneumonia ?  Chronic obstructive pulmonary disease (HCC) ? ? ?Acute on chronic respiratory failure hypoxia  we will continue with current trach management awaiting anesthesias input but I think the patient could likely be decannulated if he can handle his secretions ?Coronary artery disease supportive care status post CABG ?Acute stroke with right hemiparesis ?Hospital-acquired pneumonia has been treated with antibiotics ?Chronic obstructive pulmonary disease no change ? ? ?I have personally seen and evaluated the patient, evaluated laboratory and imaging results, formulated the assessment and plan and placed orders. ?The Patient requires high complexity decision making with multiple systems involvement.  ?Rounds were done with the Respiratory Therapy Director and Staff therapists and discussed with nursing staff also. ? ?Yevonne Pax, MD FCCP ?Pulmonary Critical Care Medicine ?Sleep Medicine ? ?

## 2022-03-17 DIAGNOSIS — G9340 Encephalopathy, unspecified: Secondary | ICD-10-CM | POA: Diagnosis not present

## 2022-03-17 DIAGNOSIS — J449 Chronic obstructive pulmonary disease, unspecified: Secondary | ICD-10-CM | POA: Diagnosis not present

## 2022-03-17 DIAGNOSIS — R1310 Dysphagia, unspecified: Secondary | ICD-10-CM | POA: Diagnosis not present

## 2022-03-17 DIAGNOSIS — J159 Unspecified bacterial pneumonia: Secondary | ICD-10-CM | POA: Diagnosis not present

## 2022-03-17 DIAGNOSIS — J9621 Acute and chronic respiratory failure with hypoxia: Secondary | ICD-10-CM | POA: Diagnosis not present

## 2022-03-17 DIAGNOSIS — I639 Cerebral infarction, unspecified: Secondary | ICD-10-CM | POA: Diagnosis not present

## 2022-03-17 DIAGNOSIS — I2510 Atherosclerotic heart disease of native coronary artery without angina pectoris: Secondary | ICD-10-CM | POA: Diagnosis not present

## 2022-03-17 DIAGNOSIS — I10 Essential (primary) hypertension: Secondary | ICD-10-CM | POA: Diagnosis not present

## 2022-03-17 DIAGNOSIS — J9600 Acute respiratory failure, unspecified whether with hypoxia or hypercapnia: Secondary | ICD-10-CM | POA: Diagnosis not present

## 2022-03-17 LAB — CBC
HCT: 32.3 % — ABNORMAL LOW (ref 39.0–52.0)
Hemoglobin: 10.7 g/dL — ABNORMAL LOW (ref 13.0–17.0)
MCH: 31.4 pg (ref 26.0–34.0)
MCHC: 33.1 g/dL (ref 30.0–36.0)
MCV: 94.7 fL (ref 80.0–100.0)
Platelets: 519 10*3/uL — ABNORMAL HIGH (ref 150–400)
RBC: 3.41 MIL/uL — ABNORMAL LOW (ref 4.22–5.81)
RDW: 13.7 % (ref 11.5–15.5)
WBC: 21.9 10*3/uL — ABNORMAL HIGH (ref 4.0–10.5)
nRBC: 0.3 % — ABNORMAL HIGH (ref 0.0–0.2)

## 2022-03-17 LAB — BASIC METABOLIC PANEL
Anion gap: 8 (ref 5–15)
BUN: 29 mg/dL — ABNORMAL HIGH (ref 6–20)
CO2: 23 mmol/L (ref 22–32)
Calcium: 8.2 mg/dL — ABNORMAL LOW (ref 8.9–10.3)
Chloride: 103 mmol/L (ref 98–111)
Creatinine, Ser: 0.74 mg/dL (ref 0.61–1.24)
GFR, Estimated: >60 mL/min (ref >60)
Glucose, Bld: 158 mg/dL — ABNORMAL HIGH (ref 70–99)
Potassium: 4.4 mmol/L (ref 3.5–5.1)
Sodium: 134 mmol/L — ABNORMAL LOW (ref 135–145)

## 2022-03-17 LAB — CULTURE, RESPIRATORY W GRAM STAIN

## 2022-03-17 LAB — MAGNESIUM: Magnesium: 2.3 mg/dL (ref 1.7–2.4)

## 2022-03-17 NOTE — Progress Notes (Signed)
Pulmonary Critical Care Medicine ?[facility] [place] ?  ?PULMONARY CRITICAL CARE SERVICE ? ?PROGRESS NOTE ? ? ? ? ?Patrick Dunn  ?[id number]  ?DOB: 06/05/1961  ? ?DOA: 03/14/2022 ? ?Referring Physician: Luna Kitchens, MD ? ?HPI: Patrick Dunn is a 61 y.o. male being followed for ventilator/airway/oxygen weaning Acute on Chronic Respiratory Failure.  Patient at this time is capping has completed 24 hours will not be going on for 48 hours patient looks fine and comfortable without any significant distress overnight ? ?Medications: ?Reviewed on Rounds ? ?Physical Exam: ? ?Vitals: Temperature 98.1 pulse 79 respiratory 27 blood pressure 145/70 saturations 97% ? ?Ventilator Settings capping off the ventilator on 2 L oxygen ? ?General: Comfortable at this time ?Neck: supple ?Cardiovascular: no malignant arrhythmias ?Respiratory: No rhonchi no rales are noted at this time ?Skin: no rash seen on limited exam ?Musculoskeletal: No gross abnormality ?Psychiatric:unable to assess ?Neurologic:no involuntary movements   ?   ?   ?Lab Data:  ? ?Basic Metabolic Panel: ?Recent Labs  ?Lab 03/15/22 ?0076 03/17/22 ?0314  ?NA 135 134*  ?K 4.7 4.4  ?CL 103 103  ?CO2 25 23  ?GLUCOSE 128* 158*  ?BUN 29* 29*  ?CREATININE 0.74 0.74  ?CALCIUM 7.9* 8.2*  ?MG  --  2.3  ? ? ?ABG: ?No results for input(s): PHART, PCO2ART, PO2ART, HCO3, O2SAT in the last 168 hours. ? ?Liver Function Tests: ?Recent Labs  ?Lab 03/15/22 ?2263  ?AST 74*  ?ALT 95*  ?ALKPHOS 52  ?BILITOT 0.6  ?PROT 5.2*  ?ALBUMIN 2.2*  ? ?No results for input(s): LIPASE, AMYLASE in the last 168 hours. ?No results for input(s): AMMONIA in the last 168 hours. ? ?CBC: ?Recent Labs  ?Lab 03/15/22 ?3354 03/17/22 ?0314  ?WBC 17.0* 21.9*  ?NEUTROABS 12.6*  --   ?HGB 9.7* 10.7*  ?HCT 29.4* 32.3*  ?MCV 95.1 94.7  ?PLT 409* 519*  ? ? ?Cardiac Enzymes: ?No results for input(s): CKTOTAL, CKMB, CKMBINDEX, TROPONINI in the last 168 hours. ? ?BNP (last 3 results) ?No results for input(s): BNP in  the last 8760 hours. ? ?ProBNP (last 3 results) ?No results for input(s): PROBNP in the last 8760 hours. ? ?Radiological Exams: ?No results found. ? ?Assessment/Plan ?Active Problems: ?  Acute on chronic respiratory failure with hypoxia (HCC) ?  CAD (coronary artery disease), native coronary artery ?  Acute stroke due to ischemia [facility]) ?  Hospital-acquired bacterial pneumonia ?  Chronic obstructive pulmonary disease (HCC) ? ? ?Acute on chronic respiratory failure hypoxia plan is going to be to continue with the weaning process will be completing 48 hours capping tomorrow ?Coronary artery disease no change we will continue to follow along closely ?Acute stroke supportive care ?Hospital-acquired pneumonia has been treated with antibiotics ?Chronic obstructive pulmonary disease no change we will continue to follow along closely ? ? ?I have personally seen and evaluated the patient, evaluated laboratory and imaging results, formulated the assessment and plan and placed orders. ?The Patient requires high complexity decision making with multiple systems involvement.  ?Rounds were done with the Respiratory Therapy Director and Staff therapists and discussed with nursing staff also. ? ?Yevonne Pax, MD FCCP ?Pulmonary Critical Care Medicine ?Sleep Medicine ? ?

## 2022-03-18 DIAGNOSIS — I639 Cerebral infarction, unspecified: Secondary | ICD-10-CM | POA: Diagnosis not present

## 2022-03-18 DIAGNOSIS — I10 Essential (primary) hypertension: Secondary | ICD-10-CM | POA: Diagnosis not present

## 2022-03-18 DIAGNOSIS — J9621 Acute and chronic respiratory failure with hypoxia: Secondary | ICD-10-CM | POA: Diagnosis not present

## 2022-03-18 DIAGNOSIS — G9340 Encephalopathy, unspecified: Secondary | ICD-10-CM | POA: Diagnosis not present

## 2022-03-18 DIAGNOSIS — J159 Unspecified bacterial pneumonia: Secondary | ICD-10-CM | POA: Diagnosis not present

## 2022-03-18 DIAGNOSIS — J9600 Acute respiratory failure, unspecified whether with hypoxia or hypercapnia: Secondary | ICD-10-CM | POA: Diagnosis not present

## 2022-03-18 DIAGNOSIS — J690 Pneumonitis due to inhalation of food and vomit: Secondary | ICD-10-CM | POA: Diagnosis not present

## 2022-03-18 DIAGNOSIS — J449 Chronic obstructive pulmonary disease, unspecified: Secondary | ICD-10-CM | POA: Diagnosis not present

## 2022-03-18 DIAGNOSIS — I2510 Atherosclerotic heart disease of native coronary artery without angina pectoris: Secondary | ICD-10-CM | POA: Diagnosis not present

## 2022-03-18 NOTE — Progress Notes (Signed)
Pulmonary Critical Care Medicine ?[facility] [place] ?  ?PULMONARY CRITICAL CARE SERVICE ? ?PROGRESS NOTE ? ? ? ? ?Patrick Dunn  ?[id number]  ?DOB: 1961/04/28  ? ?DOA: 03/14/2022 ? ?Referring Physician: Luna Kitchens, MD ? ?HPI: Patrick Dunn is a 61 y.o. male being followed for ventilator/airway/oxygen weaning Acute on Chronic Respiratory Failure.  Patient is capping is actually done well over the weekend ready for decannulation.  The patient's wife was in the room she was updated along with the patient's sister by phone.  I explained to them that he does remain at risk for aspiration because it has a history of stroke also probably will need ongoing rehab ? ?Medications: ?Reviewed on Rounds ? ?Physical Exam: ? ?Vitals: Temperature is 98.4 pulse 96 respiratory 30 blood pressure 150/83 saturations 94% ? ?Ventilator Settings capping off the ventilator ? ?General: Comfortable at this time ?Neck: supple ?Cardiovascular: no malignant arrhythmias ?Respiratory: Scattered rhonchi expansion is equal ?Skin: no rash seen on limited exam ?Musculoskeletal: No gross abnormality ?Psychiatric:unable to assess ?Neurologic:no involuntary movements   ?   ?   ?Lab Data:  ? ?Basic Metabolic Panel: ?Recent Labs  ?Lab 03/15/22 ?6606 03/17/22 ?0314  ?NA 135 134*  ?K 4.7 4.4  ?CL 103 103  ?CO2 25 23  ?GLUCOSE 128* 158*  ?BUN 29* 29*  ?CREATININE 0.74 0.74  ?CALCIUM 7.9* 8.2*  ?MG  --  2.3  ? ? ?ABG: ?No results for input(s): PHART, PCO2ART, PO2ART, HCO3, O2SAT in the last 168 hours. ? ?Liver Function Tests: ?Recent Labs  ?Lab 03/15/22 ?0045  ?AST 74*  ?ALT 95*  ?ALKPHOS 52  ?BILITOT 0.6  ?PROT 5.2*  ?ALBUMIN 2.2*  ? ?No results for input(s): LIPASE, AMYLASE in the last 168 hours. ?No results for input(s): AMMONIA in the last 168 hours. ? ?CBC: ?Recent Labs  ?Lab 03/15/22 ?9977 03/17/22 ?0314  ?WBC 17.0* 21.9*  ?NEUTROABS 12.6*  --   ?HGB 9.7* 10.7*  ?HCT 29.4* 32.3*  ?MCV 95.1 94.7  ?PLT 409* 519*  ? ? ?Cardiac Enzymes: ?No results  for input(s): CKTOTAL, CKMB, CKMBINDEX, TROPONINI in the last 168 hours. ? ?BNP (last 3 results) ?No results for input(s): BNP in the last 8760 hours. ? ?ProBNP (last 3 results) ?No results for input(s): PROBNP in the last 8760 hours. ? ?Radiological Exams: ?No results found. ? ?Assessment/Plan ?Active Problems: ?  Acute on chronic respiratory failure with hypoxia (HCC) ?  CAD (coronary artery disease), native coronary artery ?  Acute stroke due to ischemia [facility]) ?  Hospital-acquired bacterial pneumonia ?  Chronic obstructive pulmonary disease (HCC) ? ? ?Acute on chronic respiratory failure hypoxia had done well with the capping plan is going to be to proceed to decannulation ?Coronary artery disease patient is at baseline we will continue to monitor ?Acute stroke will need ongoing therapy ?Hospital-acquired pneumonia has been treated improved clinically ?COPD supportive care we will continue to follow along ? ? ?I have personally seen and evaluated the patient, evaluated laboratory and imaging results, formulated the assessment and plan and placed orders. ?The Patient requires high complexity decision making with multiple systems involvement.  ?Rounds were done with the Respiratory Therapy Director and Staff therapists and discussed with nursing staff also. ? ?Yevonne Pax, MD FCCP ?Pulmonary Critical Care Medicine ?Sleep Medicine ? ?

## 2022-03-19 ENCOUNTER — Other Ambulatory Visit (HOSPITAL_COMMUNITY): Payer: No Typology Code available for payment source

## 2022-03-19 DIAGNOSIS — I639 Cerebral infarction, unspecified: Secondary | ICD-10-CM | POA: Diagnosis not present

## 2022-03-19 DIAGNOSIS — I2510 Atherosclerotic heart disease of native coronary artery without angina pectoris: Secondary | ICD-10-CM | POA: Diagnosis not present

## 2022-03-19 DIAGNOSIS — J449 Chronic obstructive pulmonary disease, unspecified: Secondary | ICD-10-CM | POA: Diagnosis not present

## 2022-03-19 DIAGNOSIS — J9600 Acute respiratory failure, unspecified whether with hypoxia or hypercapnia: Secondary | ICD-10-CM | POA: Diagnosis not present

## 2022-03-19 DIAGNOSIS — G9340 Encephalopathy, unspecified: Secondary | ICD-10-CM | POA: Diagnosis not present

## 2022-03-19 DIAGNOSIS — J159 Unspecified bacterial pneumonia: Secondary | ICD-10-CM | POA: Diagnosis not present

## 2022-03-19 DIAGNOSIS — J9621 Acute and chronic respiratory failure with hypoxia: Secondary | ICD-10-CM | POA: Diagnosis not present

## 2022-03-19 DIAGNOSIS — J690 Pneumonitis due to inhalation of food and vomit: Secondary | ICD-10-CM | POA: Diagnosis not present

## 2022-03-19 DIAGNOSIS — R1310 Dysphagia, unspecified: Secondary | ICD-10-CM | POA: Diagnosis not present

## 2022-03-19 DIAGNOSIS — D72829 Elevated white blood cell count, unspecified: Secondary | ICD-10-CM | POA: Diagnosis not present

## 2022-03-19 LAB — BASIC METABOLIC PANEL
Anion gap: 8 (ref 5–15)
BUN: 40 mg/dL — ABNORMAL HIGH (ref 6–20)
CO2: 23 mmol/L (ref 22–32)
Calcium: 8.5 mg/dL — ABNORMAL LOW (ref 8.9–10.3)
Chloride: 108 mmol/L (ref 98–111)
Creatinine, Ser: 0.77 mg/dL (ref 0.61–1.24)
GFR, Estimated: >60 mL/min (ref >60)
Glucose, Bld: 164 mg/dL — ABNORMAL HIGH (ref 70–99)
Potassium: 4.6 mmol/L (ref 3.5–5.1)
Sodium: 139 mmol/L (ref 135–145)

## 2022-03-19 LAB — CBC
HCT: 33.8 % — ABNORMAL LOW (ref 39.0–52.0)
Hemoglobin: 11.3 g/dL — ABNORMAL LOW (ref 13.0–17.0)
MCH: 31.9 pg (ref 26.0–34.0)
MCHC: 33.4 g/dL (ref 30.0–36.0)
MCV: 95.5 fL (ref 80.0–100.0)
Platelets: 594 10*3/uL — ABNORMAL HIGH (ref 150–400)
RBC: 3.54 MIL/uL — ABNORMAL LOW (ref 4.22–5.81)
RDW: 14 % (ref 11.5–15.5)
WBC: 22.1 10*3/uL — ABNORMAL HIGH (ref 4.0–10.5)
nRBC: 0.1 % (ref 0.0–0.2)

## 2022-03-19 LAB — MAGNESIUM: Magnesium: 2.4 mg/dL (ref 1.7–2.4)

## 2022-03-19 NOTE — Progress Notes (Signed)
Pulmonary Critical Care Medicine ?[facility] [place] ?  ?PULMONARY CRITICAL CARE SERVICE ? ?PROGRESS NOTE ? ? ? ? ?Dellis Anes  ?[id number]  ?DOB: June 08, 1961  ? ?DOA: 03/14/2022 ? ?Referring Physician: Luna Kitchens, MD ? ?HPI: Patrick Dunn is a 61 y.o. male being followed for ventilator/airway/oxygen weaning Acute on Chronic Respiratory Failure.  Patient is resting comfortably without distress has been on 2 L nasal cannula status post decannulation ? ?Medications: ?Reviewed on Rounds ? ?Physical Exam: ? ?Vitals: Temperature is 97.9 pulse 73 respiratory rate is 14 blood pressure 143/84 saturations 99% ? ?Ventilator Settings on nasal cannula 2 L ? ?General: Comfortable at this time ?Neck: supple ?Cardiovascular: no malignant arrhythmias ?Respiratory: Scattered rhonchi expansion is equal ?Skin: no rash seen on limited exam ?Musculoskeletal: No gross abnormality ?Psychiatric:unable to assess ?Neurologic:no involuntary movements   ?   ?   ?Lab Data:  ? ?Basic Metabolic Panel: ?Recent Labs  ?Lab 03/15/22 ?3545 03/17/22 ?6256 03/19/22 ?0444  ?NA 135 134* 139  ?K 4.7 4.4 4.6  ?CL 103 103 108  ?CO2 25 23 23   ?GLUCOSE 128* 158* 164*  ?BUN 29* 29* 40*  ?CREATININE 0.74 0.74 0.77  ?CALCIUM 7.9* 8.2* 8.5*  ?MG  --  2.3 2.4  ? ? ?ABG: ?No results for input(s): PHART, PCO2ART, PO2ART, HCO3, O2SAT in the last 168 hours. ? ?Liver Function Tests: ?Recent Labs  ?Lab 03/15/22 ?03/17/22  ?AST 74*  ?ALT 95*  ?ALKPHOS 52  ?BILITOT 0.6  ?PROT 5.2*  ?ALBUMIN 2.2*  ? ?No results for input(s): LIPASE, AMYLASE in the last 168 hours. ?No results for input(s): AMMONIA in the last 168 hours. ? ?CBC: ?Recent Labs  ?Lab 03/15/22 ?03/17/22 03/17/22 ?03/19/22 03/19/22 ?0444  ?WBC 17.0* 21.9* 22.1*  ?NEUTROABS 12.6*  --   --   ?HGB 9.7* 10.7* 11.3*  ?HCT 29.4* 32.3* 33.8*  ?MCV 95.1 94.7 95.5  ?PLT 409* 519* 594*  ? ? ?Cardiac Enzymes: ?No results for input(s): CKTOTAL, CKMB, CKMBINDEX, TROPONINI in the last 168 hours. ? ?BNP (last 3 results) ?No  results for input(s): BNP in the last 8760 hours. ? ?ProBNP (last 3 results) ?No results for input(s): PROBNP in the last 8760 hours. ? ?Radiological Exams: ?No results found. ? ?Assessment/Plan ?Active Problems: ?  Acute on chronic respiratory failure with hypoxia (HCC) ?  CAD (coronary artery disease), native coronary artery ?  Acute stroke due to ischemia [facility]) ?  Hospital-acquired bacterial pneumonia ?  Chronic obstructive pulmonary disease (HCC) ? ? ?Acute on chronic respiratory failure hypoxia we will continue with 2 L oxygen ?Coronary artery disease supportive care we will continue to follow along closely ?Acute stroke supportive care no change overall ?Hospital-acquired pneumonia patient has been treated with antibiotics ?Chronic obstructive pulmonary disease no change we will continue to follow along ? ? ?I have personally seen and evaluated the patient, evaluated laboratory and imaging results, formulated the assessment and plan and placed orders. ?The Patient requires high complexity decision making with multiple systems involvement.  ?Rounds were done with the Respiratory Therapy Director and Staff therapists and discussed with nursing staff also. ? ?[facility], MD FCCP ?Pulmonary Critical Care Medicine ?Sleep Medicine ? ?

## 2022-03-20 DIAGNOSIS — J9600 Acute respiratory failure, unspecified whether with hypoxia or hypercapnia: Secondary | ICD-10-CM | POA: Diagnosis not present

## 2022-03-20 DIAGNOSIS — J690 Pneumonitis due to inhalation of food and vomit: Secondary | ICD-10-CM | POA: Diagnosis not present

## 2022-03-20 DIAGNOSIS — G9340 Encephalopathy, unspecified: Secondary | ICD-10-CM | POA: Diagnosis not present

## 2022-03-20 DIAGNOSIS — I10 Essential (primary) hypertension: Secondary | ICD-10-CM | POA: Diagnosis not present

## 2022-03-21 DIAGNOSIS — I6359 Cerebral infarction due to unspecified occlusion or stenosis of other cerebral artery: Secondary | ICD-10-CM | POA: Diagnosis not present

## 2022-03-21 DIAGNOSIS — J690 Pneumonitis due to inhalation of food and vomit: Secondary | ICD-10-CM | POA: Diagnosis not present

## 2022-03-21 DIAGNOSIS — I25798 Atherosclerosis of other coronary artery bypass graft(s) with other forms of angina pectoris: Secondary | ICD-10-CM | POA: Diagnosis not present

## 2022-03-21 DIAGNOSIS — J9601 Acute respiratory failure with hypoxia: Secondary | ICD-10-CM | POA: Diagnosis not present

## 2022-03-22 DIAGNOSIS — J9601 Acute respiratory failure with hypoxia: Secondary | ICD-10-CM | POA: Diagnosis not present

## 2022-03-22 DIAGNOSIS — I6359 Cerebral infarction due to unspecified occlusion or stenosis of other cerebral artery: Secondary | ICD-10-CM | POA: Diagnosis not present

## 2022-03-22 DIAGNOSIS — I25798 Atherosclerosis of other coronary artery bypass graft(s) with other forms of angina pectoris: Secondary | ICD-10-CM | POA: Diagnosis not present

## 2022-03-22 DIAGNOSIS — J690 Pneumonitis due to inhalation of food and vomit: Secondary | ICD-10-CM | POA: Diagnosis not present

## 2022-03-22 LAB — CBC
HCT: 35.7 % — ABNORMAL LOW (ref 39.0–52.0)
Hemoglobin: 11.5 g/dL — ABNORMAL LOW (ref 13.0–17.0)
MCH: 30.9 pg (ref 26.0–34.0)
MCHC: 32.2 g/dL (ref 30.0–36.0)
MCV: 96 fL (ref 80.0–100.0)
Platelets: 501 10*3/uL — ABNORMAL HIGH (ref 150–400)
RBC: 3.72 MIL/uL — ABNORMAL LOW (ref 4.22–5.81)
RDW: 14.1 % (ref 11.5–15.5)
WBC: 10.3 10*3/uL (ref 4.0–10.5)
nRBC: 0 % (ref 0.0–0.2)

## 2022-03-22 LAB — BASIC METABOLIC PANEL
Anion gap: 7 (ref 5–15)
BUN: 33 mg/dL — ABNORMAL HIGH (ref 6–20)
CO2: 26 mmol/L (ref 22–32)
Calcium: 8.6 mg/dL — ABNORMAL LOW (ref 8.9–10.3)
Chloride: 107 mmol/L (ref 98–111)
Creatinine, Ser: 0.82 mg/dL (ref 0.61–1.24)
GFR, Estimated: >60 mL/min (ref >60)
Glucose, Bld: 133 mg/dL — ABNORMAL HIGH (ref 70–99)
Potassium: 4.3 mmol/L (ref 3.5–5.1)
Sodium: 140 mmol/L (ref 135–145)

## 2022-03-22 LAB — MAGNESIUM: Magnesium: 2.4 mg/dL (ref 1.7–2.4)

## 2022-03-22 LAB — C-REACTIVE PROTEIN: CRP: 1.2 mg/dL — ABNORMAL HIGH (ref <1.0)

## 2022-03-23 DIAGNOSIS — I6359 Cerebral infarction due to unspecified occlusion or stenosis of other cerebral artery: Secondary | ICD-10-CM | POA: Diagnosis not present

## 2022-03-23 DIAGNOSIS — I25798 Atherosclerosis of other coronary artery bypass graft(s) with other forms of angina pectoris: Secondary | ICD-10-CM | POA: Diagnosis not present

## 2022-03-23 DIAGNOSIS — J690 Pneumonitis due to inhalation of food and vomit: Secondary | ICD-10-CM | POA: Diagnosis not present

## 2022-03-23 DIAGNOSIS — J9601 Acute respiratory failure with hypoxia: Secondary | ICD-10-CM | POA: Diagnosis not present

## 2022-03-24 DIAGNOSIS — J9601 Acute respiratory failure with hypoxia: Secondary | ICD-10-CM | POA: Diagnosis not present

## 2022-03-24 DIAGNOSIS — I25798 Atherosclerosis of other coronary artery bypass graft(s) with other forms of angina pectoris: Secondary | ICD-10-CM | POA: Diagnosis not present

## 2022-03-24 DIAGNOSIS — J690 Pneumonitis due to inhalation of food and vomit: Secondary | ICD-10-CM | POA: Diagnosis not present

## 2022-03-24 DIAGNOSIS — I6359 Cerebral infarction due to unspecified occlusion or stenosis of other cerebral artery: Secondary | ICD-10-CM | POA: Diagnosis not present

## 2022-03-25 DIAGNOSIS — I6359 Cerebral infarction due to unspecified occlusion or stenosis of other cerebral artery: Secondary | ICD-10-CM | POA: Diagnosis not present

## 2022-03-25 DIAGNOSIS — J690 Pneumonitis due to inhalation of food and vomit: Secondary | ICD-10-CM | POA: Diagnosis not present

## 2022-03-25 DIAGNOSIS — J9601 Acute respiratory failure with hypoxia: Secondary | ICD-10-CM | POA: Diagnosis not present

## 2022-03-25 DIAGNOSIS — I25798 Atherosclerosis of other coronary artery bypass graft(s) with other forms of angina pectoris: Secondary | ICD-10-CM | POA: Diagnosis not present

## 2022-03-25 LAB — CBC
HCT: 42.3 % (ref 39.0–52.0)
Hemoglobin: 13.6 g/dL (ref 13.0–17.0)
MCH: 30.7 pg (ref 26.0–34.0)
MCHC: 32.2 g/dL (ref 30.0–36.0)
MCV: 95.5 fL (ref 80.0–100.0)
Platelets: 498 10*3/uL — ABNORMAL HIGH (ref 150–400)
RBC: 4.43 MIL/uL (ref 4.22–5.81)
RDW: 14.6 % (ref 11.5–15.5)
WBC: 9.4 10*3/uL (ref 4.0–10.5)
nRBC: 0 % (ref 0.0–0.2)

## 2022-03-25 LAB — COMPREHENSIVE METABOLIC PANEL
ALT: 213 U/L — ABNORMAL HIGH (ref 0–44)
AST: 71 U/L — ABNORMAL HIGH (ref 15–41)
Albumin: 2.9 g/dL — ABNORMAL LOW (ref 3.5–5.0)
Alkaline Phosphatase: 127 U/L — ABNORMAL HIGH (ref 38–126)
Anion gap: 7 (ref 5–15)
BUN: 34 mg/dL — ABNORMAL HIGH (ref 6–20)
CO2: 24 mmol/L (ref 22–32)
Calcium: 8.8 mg/dL — ABNORMAL LOW (ref 8.9–10.3)
Chloride: 105 mmol/L (ref 98–111)
Creatinine, Ser: 0.76 mg/dL (ref 0.61–1.24)
GFR, Estimated: >60 mL/min (ref >60)
Glucose, Bld: 135 mg/dL — ABNORMAL HIGH (ref 70–99)
Potassium: 4.6 mmol/L (ref 3.5–5.1)
Sodium: 136 mmol/L (ref 135–145)
Total Bilirubin: 0.8 mg/dL (ref 0.3–1.2)
Total Protein: 6.2 g/dL — ABNORMAL LOW (ref 6.5–8.1)

## 2022-03-25 LAB — C-REACTIVE PROTEIN: CRP: 0.7 mg/dL (ref <1.0)

## 2022-03-26 DIAGNOSIS — J690 Pneumonitis due to inhalation of food and vomit: Secondary | ICD-10-CM | POA: Diagnosis not present

## 2022-03-26 DIAGNOSIS — I6359 Cerebral infarction due to unspecified occlusion or stenosis of other cerebral artery: Secondary | ICD-10-CM | POA: Diagnosis not present

## 2022-03-26 DIAGNOSIS — J9601 Acute respiratory failure with hypoxia: Secondary | ICD-10-CM | POA: Diagnosis not present

## 2022-03-26 DIAGNOSIS — I25798 Atherosclerosis of other coronary artery bypass graft(s) with other forms of angina pectoris: Secondary | ICD-10-CM | POA: Diagnosis not present

## 2022-03-27 DIAGNOSIS — J9601 Acute respiratory failure with hypoxia: Secondary | ICD-10-CM | POA: Diagnosis not present

## 2022-03-27 DIAGNOSIS — I6359 Cerebral infarction due to unspecified occlusion or stenosis of other cerebral artery: Secondary | ICD-10-CM | POA: Diagnosis not present

## 2022-03-27 DIAGNOSIS — J690 Pneumonitis due to inhalation of food and vomit: Secondary | ICD-10-CM | POA: Diagnosis not present

## 2022-03-27 DIAGNOSIS — I25798 Atherosclerosis of other coronary artery bypass graft(s) with other forms of angina pectoris: Secondary | ICD-10-CM | POA: Diagnosis not present

## 2022-03-28 DIAGNOSIS — J690 Pneumonitis due to inhalation of food and vomit: Secondary | ICD-10-CM | POA: Diagnosis not present

## 2022-03-28 DIAGNOSIS — I10 Essential (primary) hypertension: Secondary | ICD-10-CM | POA: Diagnosis not present

## 2022-03-28 DIAGNOSIS — E46 Unspecified protein-calorie malnutrition: Secondary | ICD-10-CM | POA: Diagnosis not present

## 2022-03-28 DIAGNOSIS — J9600 Acute respiratory failure, unspecified whether with hypoxia or hypercapnia: Secondary | ICD-10-CM | POA: Diagnosis not present

## 2022-03-29 DIAGNOSIS — E46 Unspecified protein-calorie malnutrition: Secondary | ICD-10-CM | POA: Diagnosis not present

## 2022-03-29 DIAGNOSIS — I10 Essential (primary) hypertension: Secondary | ICD-10-CM | POA: Diagnosis not present

## 2022-03-29 DIAGNOSIS — J9600 Acute respiratory failure, unspecified whether with hypoxia or hypercapnia: Secondary | ICD-10-CM | POA: Diagnosis not present

## 2022-03-29 DIAGNOSIS — I25810 Atherosclerosis of coronary artery bypass graft(s) without angina pectoris: Secondary | ICD-10-CM | POA: Diagnosis not present

## 2022-03-29 LAB — CBC
HCT: 42.7 % (ref 39.0–52.0)
Hemoglobin: 13.9 g/dL (ref 13.0–17.0)
MCH: 30.9 pg (ref 26.0–34.0)
MCHC: 32.6 g/dL (ref 30.0–36.0)
MCV: 94.9 fL (ref 80.0–100.0)
Platelets: 322 10*3/uL (ref 150–400)
RBC: 4.5 MIL/uL (ref 4.22–5.81)
RDW: 14.8 % (ref 11.5–15.5)
WBC: 8.7 10*3/uL (ref 4.0–10.5)
nRBC: 0 % (ref 0.0–0.2)

## 2022-03-29 LAB — BASIC METABOLIC PANEL
Anion gap: 7 (ref 5–15)
BUN: 41 mg/dL — ABNORMAL HIGH (ref 6–20)
CO2: 26 mmol/L (ref 22–32)
Calcium: 8.9 mg/dL (ref 8.9–10.3)
Chloride: 104 mmol/L (ref 98–111)
Creatinine, Ser: 0.75 mg/dL (ref 0.61–1.24)
GFR, Estimated: >60 mL/min (ref >60)
Glucose, Bld: 158 mg/dL — ABNORMAL HIGH (ref 70–99)
Potassium: 4.2 mmol/L (ref 3.5–5.1)
Sodium: 137 mmol/L (ref 135–145)

## 2022-03-29 LAB — MAGNESIUM: Magnesium: 2.2 mg/dL (ref 1.7–2.4)

## 2022-03-30 DIAGNOSIS — J690 Pneumonitis due to inhalation of food and vomit: Secondary | ICD-10-CM | POA: Diagnosis not present

## 2022-03-30 DIAGNOSIS — J9600 Acute respiratory failure, unspecified whether with hypoxia or hypercapnia: Secondary | ICD-10-CM | POA: Diagnosis not present

## 2022-03-30 DIAGNOSIS — G9340 Encephalopathy, unspecified: Secondary | ICD-10-CM | POA: Diagnosis not present

## 2022-03-30 DIAGNOSIS — R1310 Dysphagia, unspecified: Secondary | ICD-10-CM | POA: Diagnosis not present

## 2022-03-31 ENCOUNTER — Other Ambulatory Visit (HOSPITAL_COMMUNITY): Payer: No Typology Code available for payment source

## 2022-03-31 DIAGNOSIS — R1310 Dysphagia, unspecified: Secondary | ICD-10-CM | POA: Diagnosis not present

## 2022-03-31 DIAGNOSIS — Z4682 Encounter for fitting and adjustment of non-vascular catheter: Secondary | ICD-10-CM | POA: Diagnosis not present

## 2022-03-31 DIAGNOSIS — J9600 Acute respiratory failure, unspecified whether with hypoxia or hypercapnia: Secondary | ICD-10-CM | POA: Diagnosis not present

## 2022-03-31 DIAGNOSIS — G9340 Encephalopathy, unspecified: Secondary | ICD-10-CM | POA: Diagnosis not present

## 2022-03-31 DIAGNOSIS — J690 Pneumonitis due to inhalation of food and vomit: Secondary | ICD-10-CM | POA: Diagnosis not present

## 2022-04-01 DIAGNOSIS — J9600 Acute respiratory failure, unspecified whether with hypoxia or hypercapnia: Secondary | ICD-10-CM | POA: Diagnosis not present

## 2022-04-01 DIAGNOSIS — J690 Pneumonitis due to inhalation of food and vomit: Secondary | ICD-10-CM | POA: Diagnosis not present

## 2022-04-01 DIAGNOSIS — G9340 Encephalopathy, unspecified: Secondary | ICD-10-CM | POA: Diagnosis not present

## 2022-04-01 DIAGNOSIS — R1310 Dysphagia, unspecified: Secondary | ICD-10-CM | POA: Diagnosis not present

## 2022-04-02 ENCOUNTER — Other Ambulatory Visit (HOSPITAL_COMMUNITY): Payer: No Typology Code available for payment source

## 2022-04-02 DIAGNOSIS — I10 Essential (primary) hypertension: Secondary | ICD-10-CM | POA: Diagnosis not present

## 2022-04-02 DIAGNOSIS — J9600 Acute respiratory failure, unspecified whether with hypoxia or hypercapnia: Secondary | ICD-10-CM | POA: Diagnosis not present

## 2022-04-02 DIAGNOSIS — J690 Pneumonitis due to inhalation of food and vomit: Secondary | ICD-10-CM | POA: Diagnosis not present

## 2022-04-02 DIAGNOSIS — E46 Unspecified protein-calorie malnutrition: Secondary | ICD-10-CM | POA: Diagnosis not present

## 2022-04-02 LAB — CBC
HCT: 45 % (ref 39.0–52.0)
Hemoglobin: 14.6 g/dL (ref 13.0–17.0)
MCH: 31.3 pg (ref 26.0–34.0)
MCHC: 32.4 g/dL (ref 30.0–36.0)
MCV: 96.4 fL (ref 80.0–100.0)
Platelets: 211 10*3/uL (ref 150–400)
RBC: 4.67 MIL/uL (ref 4.22–5.81)
RDW: 15.4 % (ref 11.5–15.5)
WBC: 11.4 10*3/uL — ABNORMAL HIGH (ref 4.0–10.5)
nRBC: 0 % (ref 0.0–0.2)

## 2022-04-02 LAB — BASIC METABOLIC PANEL
Anion gap: 9 (ref 5–15)
BUN: 40 mg/dL — ABNORMAL HIGH (ref 6–20)
CO2: 25 mmol/L (ref 22–32)
Calcium: 9 mg/dL (ref 8.9–10.3)
Chloride: 105 mmol/L (ref 98–111)
Creatinine, Ser: 0.69 mg/dL (ref 0.61–1.24)
GFR, Estimated: >60 mL/min (ref >60)
Glucose, Bld: 154 mg/dL — ABNORMAL HIGH (ref 70–99)
Potassium: 3.8 mmol/L (ref 3.5–5.1)
Sodium: 139 mmol/L (ref 135–145)

## 2022-04-02 LAB — MAGNESIUM: Magnesium: 2.2 mg/dL (ref 1.7–2.4)

## 2022-04-03 DIAGNOSIS — J9600 Acute respiratory failure, unspecified whether with hypoxia or hypercapnia: Secondary | ICD-10-CM | POA: Diagnosis not present

## 2022-04-03 DIAGNOSIS — E46 Unspecified protein-calorie malnutrition: Secondary | ICD-10-CM | POA: Diagnosis not present

## 2022-04-03 DIAGNOSIS — J690 Pneumonitis due to inhalation of food and vomit: Secondary | ICD-10-CM | POA: Diagnosis not present

## 2022-04-03 DIAGNOSIS — R1310 Dysphagia, unspecified: Secondary | ICD-10-CM | POA: Diagnosis not present

## 2022-04-04 DIAGNOSIS — J690 Pneumonitis due to inhalation of food and vomit: Secondary | ICD-10-CM | POA: Diagnosis not present

## 2022-04-04 DIAGNOSIS — I6359 Cerebral infarction due to unspecified occlusion or stenosis of other cerebral artery: Secondary | ICD-10-CM | POA: Diagnosis not present

## 2022-04-04 DIAGNOSIS — J9601 Acute respiratory failure with hypoxia: Secondary | ICD-10-CM | POA: Diagnosis not present

## 2022-04-04 DIAGNOSIS — I25798 Atherosclerosis of other coronary artery bypass graft(s) with other forms of angina pectoris: Secondary | ICD-10-CM | POA: Diagnosis not present

## 2022-04-04 LAB — CBC
HCT: 41.5 % (ref 39.0–52.0)
Hemoglobin: 13.1 g/dL (ref 13.0–17.0)
MCH: 30.5 pg (ref 26.0–34.0)
MCHC: 31.6 g/dL (ref 30.0–36.0)
MCV: 96.7 fL (ref 80.0–100.0)
Platelets: 169 10*3/uL (ref 150–400)
RBC: 4.29 MIL/uL (ref 4.22–5.81)
RDW: 15.4 % (ref 11.5–15.5)
WBC: 8.7 10*3/uL (ref 4.0–10.5)
nRBC: 0 % (ref 0.0–0.2)

## 2022-04-04 LAB — BASIC METABOLIC PANEL
Anion gap: 7 (ref 5–15)
BUN: 30 mg/dL — ABNORMAL HIGH (ref 6–20)
CO2: 27 mmol/L (ref 22–32)
Calcium: 8.8 mg/dL — ABNORMAL LOW (ref 8.9–10.3)
Chloride: 104 mmol/L (ref 98–111)
Creatinine, Ser: 0.66 mg/dL (ref 0.61–1.24)
GFR, Estimated: >60 mL/min (ref >60)
Glucose, Bld: 119 mg/dL — ABNORMAL HIGH (ref 70–99)
Potassium: 4.1 mmol/L (ref 3.5–5.1)
Sodium: 138 mmol/L (ref 135–145)

## 2022-04-04 LAB — MAGNESIUM: Magnesium: 2.1 mg/dL (ref 1.7–2.4)

## 2022-04-05 DIAGNOSIS — I6359 Cerebral infarction due to unspecified occlusion or stenosis of other cerebral artery: Secondary | ICD-10-CM | POA: Diagnosis not present

## 2022-04-05 DIAGNOSIS — J690 Pneumonitis due to inhalation of food and vomit: Secondary | ICD-10-CM | POA: Diagnosis not present

## 2022-04-05 DIAGNOSIS — I25798 Atherosclerosis of other coronary artery bypass graft(s) with other forms of angina pectoris: Secondary | ICD-10-CM | POA: Diagnosis not present

## 2022-04-05 DIAGNOSIS — J9601 Acute respiratory failure with hypoxia: Secondary | ICD-10-CM | POA: Diagnosis not present

## 2022-04-06 DIAGNOSIS — I25798 Atherosclerosis of other coronary artery bypass graft(s) with other forms of angina pectoris: Secondary | ICD-10-CM | POA: Diagnosis not present

## 2022-04-06 DIAGNOSIS — J9601 Acute respiratory failure with hypoxia: Secondary | ICD-10-CM | POA: Diagnosis not present

## 2022-04-06 DIAGNOSIS — I6359 Cerebral infarction due to unspecified occlusion or stenosis of other cerebral artery: Secondary | ICD-10-CM | POA: Diagnosis not present

## 2022-04-06 DIAGNOSIS — J690 Pneumonitis due to inhalation of food and vomit: Secondary | ICD-10-CM | POA: Diagnosis not present

## 2022-04-07 DIAGNOSIS — J9601 Acute respiratory failure with hypoxia: Secondary | ICD-10-CM | POA: Diagnosis not present

## 2022-04-07 DIAGNOSIS — I6359 Cerebral infarction due to unspecified occlusion or stenosis of other cerebral artery: Secondary | ICD-10-CM | POA: Diagnosis not present

## 2022-04-07 DIAGNOSIS — I25798 Atherosclerosis of other coronary artery bypass graft(s) with other forms of angina pectoris: Secondary | ICD-10-CM | POA: Diagnosis not present

## 2022-04-07 DIAGNOSIS — J690 Pneumonitis due to inhalation of food and vomit: Secondary | ICD-10-CM | POA: Diagnosis not present

## 2022-04-08 DIAGNOSIS — J690 Pneumonitis due to inhalation of food and vomit: Secondary | ICD-10-CM | POA: Diagnosis not present

## 2022-04-08 DIAGNOSIS — I25798 Atherosclerosis of other coronary artery bypass graft(s) with other forms of angina pectoris: Secondary | ICD-10-CM | POA: Diagnosis not present

## 2022-04-08 DIAGNOSIS — I6359 Cerebral infarction due to unspecified occlusion or stenosis of other cerebral artery: Secondary | ICD-10-CM | POA: Diagnosis not present

## 2022-04-08 DIAGNOSIS — J9601 Acute respiratory failure with hypoxia: Secondary | ICD-10-CM | POA: Diagnosis not present

## 2022-04-08 LAB — COMPREHENSIVE METABOLIC PANEL
ALT: 83 U/L — ABNORMAL HIGH (ref 0–44)
AST: 38 U/L (ref 15–41)
Albumin: 2.6 g/dL — ABNORMAL LOW (ref 3.5–5.0)
Alkaline Phosphatase: 104 U/L (ref 38–126)
Anion gap: 8 (ref 5–15)
BUN: 27 mg/dL — ABNORMAL HIGH (ref 6–20)
CO2: 27 mmol/L (ref 22–32)
Calcium: 8.8 mg/dL — ABNORMAL LOW (ref 8.9–10.3)
Chloride: 101 mmol/L (ref 98–111)
Creatinine, Ser: 0.65 mg/dL (ref 0.61–1.24)
GFR, Estimated: >60 mL/min (ref >60)
Glucose, Bld: 102 mg/dL — ABNORMAL HIGH (ref 70–99)
Potassium: 4.4 mmol/L (ref 3.5–5.1)
Sodium: 136 mmol/L (ref 135–145)
Total Bilirubin: 0.5 mg/dL (ref 0.3–1.2)
Total Protein: 5.2 g/dL — ABNORMAL LOW (ref 6.5–8.1)

## 2022-04-08 LAB — C-REACTIVE PROTEIN: CRP: 0.7 mg/dL (ref <1.0)

## 2022-04-08 LAB — CBC
HCT: 39.5 % (ref 39.0–52.0)
Hemoglobin: 12.6 g/dL — ABNORMAL LOW (ref 13.0–17.0)
MCH: 30.7 pg (ref 26.0–34.0)
MCHC: 31.9 g/dL (ref 30.0–36.0)
MCV: 96.3 fL (ref 80.0–100.0)
Platelets: 167 10*3/uL (ref 150–400)
RBC: 4.1 MIL/uL — ABNORMAL LOW (ref 4.22–5.81)
RDW: 15.4 % (ref 11.5–15.5)
WBC: 8.2 10*3/uL (ref 4.0–10.5)
nRBC: 0 % (ref 0.0–0.2)

## 2022-04-09 DIAGNOSIS — I25798 Atherosclerosis of other coronary artery bypass graft(s) with other forms of angina pectoris: Secondary | ICD-10-CM | POA: Diagnosis not present

## 2022-04-09 DIAGNOSIS — J690 Pneumonitis due to inhalation of food and vomit: Secondary | ICD-10-CM | POA: Diagnosis not present

## 2022-04-09 DIAGNOSIS — J9601 Acute respiratory failure with hypoxia: Secondary | ICD-10-CM | POA: Diagnosis not present

## 2022-04-09 DIAGNOSIS — I6359 Cerebral infarction due to unspecified occlusion or stenosis of other cerebral artery: Secondary | ICD-10-CM | POA: Diagnosis not present

## 2022-04-10 DIAGNOSIS — J690 Pneumonitis due to inhalation of food and vomit: Secondary | ICD-10-CM | POA: Diagnosis not present

## 2022-04-10 DIAGNOSIS — I25798 Atherosclerosis of other coronary artery bypass graft(s) with other forms of angina pectoris: Secondary | ICD-10-CM | POA: Diagnosis not present

## 2022-04-10 DIAGNOSIS — I6359 Cerebral infarction due to unspecified occlusion or stenosis of other cerebral artery: Secondary | ICD-10-CM | POA: Diagnosis not present

## 2022-04-10 DIAGNOSIS — J9601 Acute respiratory failure with hypoxia: Secondary | ICD-10-CM | POA: Diagnosis not present

## 2022-04-11 DIAGNOSIS — I10 Essential (primary) hypertension: Secondary | ICD-10-CM | POA: Diagnosis not present

## 2022-04-11 DIAGNOSIS — E46 Unspecified protein-calorie malnutrition: Secondary | ICD-10-CM | POA: Diagnosis not present

## 2022-04-11 DIAGNOSIS — J9600 Acute respiratory failure, unspecified whether with hypoxia or hypercapnia: Secondary | ICD-10-CM | POA: Diagnosis not present

## 2022-04-11 DIAGNOSIS — I25810 Atherosclerosis of coronary artery bypass graft(s) without angina pectoris: Secondary | ICD-10-CM | POA: Diagnosis not present

## 2022-04-11 LAB — BASIC METABOLIC PANEL
Anion gap: 7 (ref 5–15)
BUN: 21 mg/dL — ABNORMAL HIGH (ref 6–20)
CO2: 29 mmol/L (ref 22–32)
Calcium: 9.1 mg/dL (ref 8.9–10.3)
Chloride: 101 mmol/L (ref 98–111)
Creatinine, Ser: 0.81 mg/dL (ref 0.61–1.24)
GFR, Estimated: >60 mL/min (ref >60)
Glucose, Bld: 107 mg/dL — ABNORMAL HIGH (ref 70–99)
Potassium: 4.3 mmol/L (ref 3.5–5.1)
Sodium: 137 mmol/L (ref 135–145)

## 2022-04-11 LAB — CBC
HCT: 42.1 % (ref 39.0–52.0)
Hemoglobin: 13.9 g/dL (ref 13.0–17.0)
MCH: 31.3 pg (ref 26.0–34.0)
MCHC: 33 g/dL (ref 30.0–36.0)
MCV: 94.8 fL (ref 80.0–100.0)
Platelets: 222 10*3/uL (ref 150–400)
RBC: 4.44 MIL/uL (ref 4.22–5.81)
RDW: 15.1 % (ref 11.5–15.5)
WBC: 7.2 10*3/uL (ref 4.0–10.5)
nRBC: 0 % (ref 0.0–0.2)

## 2022-04-11 LAB — C-REACTIVE PROTEIN: CRP: 0.6 mg/dL (ref <1.0)

## 2022-04-12 ENCOUNTER — Other Ambulatory Visit (HOSPITAL_COMMUNITY): Payer: No Typology Code available for payment source

## 2022-04-12 DIAGNOSIS — I10 Essential (primary) hypertension: Secondary | ICD-10-CM | POA: Diagnosis not present

## 2022-04-12 DIAGNOSIS — J9600 Acute respiratory failure, unspecified whether with hypoxia or hypercapnia: Secondary | ICD-10-CM | POA: Diagnosis not present

## 2022-04-12 DIAGNOSIS — I25810 Atherosclerosis of coronary artery bypass graft(s) without angina pectoris: Secondary | ICD-10-CM | POA: Diagnosis not present

## 2022-04-12 DIAGNOSIS — E46 Unspecified protein-calorie malnutrition: Secondary | ICD-10-CM | POA: Diagnosis not present

## 2022-04-13 DIAGNOSIS — I10 Essential (primary) hypertension: Secondary | ICD-10-CM | POA: Diagnosis not present

## 2022-04-13 DIAGNOSIS — J9600 Acute respiratory failure, unspecified whether with hypoxia or hypercapnia: Secondary | ICD-10-CM | POA: Diagnosis not present

## 2022-04-13 DIAGNOSIS — E46 Unspecified protein-calorie malnutrition: Secondary | ICD-10-CM | POA: Diagnosis not present

## 2022-04-13 DIAGNOSIS — R1310 Dysphagia, unspecified: Secondary | ICD-10-CM | POA: Diagnosis not present

## 2022-04-14 DIAGNOSIS — I10 Essential (primary) hypertension: Secondary | ICD-10-CM | POA: Diagnosis not present

## 2022-04-14 DIAGNOSIS — E46 Unspecified protein-calorie malnutrition: Secondary | ICD-10-CM | POA: Diagnosis not present

## 2022-04-14 DIAGNOSIS — I25810 Atherosclerosis of coronary artery bypass graft(s) without angina pectoris: Secondary | ICD-10-CM | POA: Diagnosis not present

## 2022-04-14 DIAGNOSIS — J9600 Acute respiratory failure, unspecified whether with hypoxia or hypercapnia: Secondary | ICD-10-CM | POA: Diagnosis not present

## 2022-04-15 DIAGNOSIS — J9601 Acute respiratory failure with hypoxia: Secondary | ICD-10-CM | POA: Diagnosis not present

## 2022-04-15 DIAGNOSIS — R1310 Dysphagia, unspecified: Secondary | ICD-10-CM | POA: Diagnosis not present

## 2022-04-15 DIAGNOSIS — J690 Pneumonitis due to inhalation of food and vomit: Secondary | ICD-10-CM | POA: Diagnosis not present

## 2022-04-15 DIAGNOSIS — I10 Essential (primary) hypertension: Secondary | ICD-10-CM | POA: Diagnosis not present

## 2022-04-16 DIAGNOSIS — R1310 Dysphagia, unspecified: Secondary | ICD-10-CM | POA: Diagnosis not present

## 2022-04-16 DIAGNOSIS — I63512 Cerebral infarction due to unspecified occlusion or stenosis of left middle cerebral artery: Secondary | ICD-10-CM | POA: Diagnosis not present

## 2022-04-16 DIAGNOSIS — J9601 Acute respiratory failure with hypoxia: Secondary | ICD-10-CM | POA: Diagnosis not present

## 2022-04-16 DIAGNOSIS — Z743 Need for continuous supervision: Secondary | ICD-10-CM | POA: Diagnosis not present

## 2022-04-16 DIAGNOSIS — G459 Transient cerebral ischemic attack, unspecified: Secondary | ICD-10-CM | POA: Diagnosis not present

## 2022-04-16 DIAGNOSIS — I10 Essential (primary) hypertension: Secondary | ICD-10-CM | POA: Diagnosis not present

## 2022-04-16 DIAGNOSIS — J690 Pneumonitis due to inhalation of food and vomit: Secondary | ICD-10-CM | POA: Diagnosis not present

## 2022-04-17 DIAGNOSIS — Z930 Tracheostomy status: Secondary | ICD-10-CM | POA: Diagnosis not present

## 2022-04-17 DIAGNOSIS — Z7902 Long term (current) use of antithrombotics/antiplatelets: Secondary | ICD-10-CM | POA: Diagnosis not present

## 2022-04-17 DIAGNOSIS — R1310 Dysphagia, unspecified: Secondary | ICD-10-CM | POA: Diagnosis not present

## 2022-04-17 DIAGNOSIS — I63232 Cerebral infarction due to unspecified occlusion or stenosis of left carotid arteries: Secondary | ICD-10-CM | POA: Diagnosis not present

## 2022-04-17 DIAGNOSIS — Z7409 Other reduced mobility: Secondary | ICD-10-CM | POA: Diagnosis not present

## 2022-04-17 DIAGNOSIS — I69319 Unspecified symptoms and signs involving cognitive functions following cerebral infarction: Secondary | ICD-10-CM | POA: Diagnosis not present

## 2022-04-17 DIAGNOSIS — I69391 Dysphagia following cerebral infarction: Secondary | ICD-10-CM | POA: Diagnosis not present

## 2022-04-17 DIAGNOSIS — I2510 Atherosclerotic heart disease of native coronary artery without angina pectoris: Secondary | ICD-10-CM | POA: Diagnosis not present

## 2022-04-17 DIAGNOSIS — Z7982 Long term (current) use of aspirin: Secondary | ICD-10-CM | POA: Diagnosis not present

## 2022-04-17 DIAGNOSIS — J95821 Acute postprocedural respiratory failure: Secondary | ICD-10-CM | POA: Diagnosis not present

## 2022-04-17 DIAGNOSIS — Z951 Presence of aortocoronary bypass graft: Secondary | ICD-10-CM | POA: Diagnosis not present

## 2022-04-17 DIAGNOSIS — I69351 Hemiplegia and hemiparesis following cerebral infarction affecting right dominant side: Secondary | ICD-10-CM | POA: Diagnosis not present

## 2022-04-18 DIAGNOSIS — I63512 Cerebral infarction due to unspecified occlusion or stenosis of left middle cerebral artery: Secondary | ICD-10-CM | POA: Diagnosis not present

## 2022-04-19 DIAGNOSIS — K5900 Constipation, unspecified: Secondary | ICD-10-CM | POA: Diagnosis not present

## 2022-04-19 DIAGNOSIS — I63512 Cerebral infarction due to unspecified occlusion or stenosis of left middle cerebral artery: Secondary | ICD-10-CM | POA: Diagnosis not present

## 2022-04-22 DIAGNOSIS — I63512 Cerebral infarction due to unspecified occlusion or stenosis of left middle cerebral artery: Secondary | ICD-10-CM | POA: Diagnosis not present

## 2022-04-23 DIAGNOSIS — I63512 Cerebral infarction due to unspecified occlusion or stenosis of left middle cerebral artery: Secondary | ICD-10-CM | POA: Diagnosis not present

## 2022-04-24 DIAGNOSIS — I63512 Cerebral infarction due to unspecified occlusion or stenosis of left middle cerebral artery: Secondary | ICD-10-CM | POA: Diagnosis not present

## 2022-04-25 DIAGNOSIS — I63512 Cerebral infarction due to unspecified occlusion or stenosis of left middle cerebral artery: Secondary | ICD-10-CM | POA: Diagnosis not present

## 2022-04-26 DIAGNOSIS — I63512 Cerebral infarction due to unspecified occlusion or stenosis of left middle cerebral artery: Secondary | ICD-10-CM | POA: Diagnosis not present

## 2022-04-29 DIAGNOSIS — I63512 Cerebral infarction due to unspecified occlusion or stenosis of left middle cerebral artery: Secondary | ICD-10-CM | POA: Diagnosis not present

## 2022-05-09 DIAGNOSIS — I2510 Atherosclerotic heart disease of native coronary artery without angina pectoris: Secondary | ICD-10-CM | POA: Diagnosis not present

## 2022-05-10 DIAGNOSIS — Z7982 Long term (current) use of aspirin: Secondary | ICD-10-CM | POA: Diagnosis not present

## 2022-05-10 DIAGNOSIS — Z8673 Personal history of transient ischemic attack (TIA), and cerebral infarction without residual deficits: Secondary | ICD-10-CM | POA: Diagnosis not present

## 2022-05-15 DIAGNOSIS — Z7409 Other reduced mobility: Secondary | ICD-10-CM | POA: Diagnosis not present

## 2022-05-15 DIAGNOSIS — I6523 Occlusion and stenosis of bilateral carotid arteries: Secondary | ICD-10-CM | POA: Diagnosis not present

## 2022-05-15 DIAGNOSIS — I2510 Atherosclerotic heart disease of native coronary artery without angina pectoris: Secondary | ICD-10-CM | POA: Diagnosis not present

## 2022-05-15 DIAGNOSIS — G8191 Hemiplegia, unspecified affecting right dominant side: Secondary | ICD-10-CM | POA: Diagnosis not present

## 2022-05-15 DIAGNOSIS — I639 Cerebral infarction, unspecified: Secondary | ICD-10-CM | POA: Diagnosis not present

## 2022-05-15 DIAGNOSIS — E782 Mixed hyperlipidemia: Secondary | ICD-10-CM | POA: Diagnosis not present

## 2022-05-15 DIAGNOSIS — Z789 Other specified health status: Secondary | ICD-10-CM | POA: Diagnosis not present

## 2022-05-15 DIAGNOSIS — I10 Essential (primary) hypertension: Secondary | ICD-10-CM | POA: Diagnosis not present

## 2022-05-15 DIAGNOSIS — Z955 Presence of coronary angioplasty implant and graft: Secondary | ICD-10-CM | POA: Diagnosis not present

## 2022-05-22 DIAGNOSIS — Z789 Other specified health status: Secondary | ICD-10-CM | POA: Diagnosis not present

## 2022-05-22 DIAGNOSIS — Z7409 Other reduced mobility: Secondary | ICD-10-CM | POA: Diagnosis not present

## 2022-05-27 DIAGNOSIS — I63512 Cerebral infarction due to unspecified occlusion or stenosis of left middle cerebral artery: Secondary | ICD-10-CM | POA: Diagnosis not present

## 2022-05-27 DIAGNOSIS — G8191 Hemiplegia, unspecified affecting right dominant side: Secondary | ICD-10-CM | POA: Diagnosis not present

## 2022-05-27 DIAGNOSIS — I6523 Occlusion and stenosis of bilateral carotid arteries: Secondary | ICD-10-CM | POA: Diagnosis not present

## 2022-05-27 DIAGNOSIS — I2510 Atherosclerotic heart disease of native coronary artery without angina pectoris: Secondary | ICD-10-CM | POA: Diagnosis not present

## 2022-05-27 DIAGNOSIS — Z789 Other specified health status: Secondary | ICD-10-CM | POA: Diagnosis not present

## 2022-05-27 DIAGNOSIS — I639 Cerebral infarction, unspecified: Secondary | ICD-10-CM | POA: Diagnosis not present

## 2022-05-27 DIAGNOSIS — M25611 Stiffness of right shoulder, not elsewhere classified: Secondary | ICD-10-CM | POA: Diagnosis not present

## 2022-05-27 DIAGNOSIS — I69320 Aphasia following cerebral infarction: Secondary | ICD-10-CM | POA: Diagnosis not present

## 2022-05-27 DIAGNOSIS — I10 Essential (primary) hypertension: Secondary | ICD-10-CM | POA: Diagnosis not present

## 2022-05-27 DIAGNOSIS — Z7409 Other reduced mobility: Secondary | ICD-10-CM | POA: Diagnosis not present

## 2022-05-27 DIAGNOSIS — E782 Mixed hyperlipidemia: Secondary | ICD-10-CM | POA: Diagnosis not present

## 2022-05-27 DIAGNOSIS — R2689 Other abnormalities of gait and mobility: Secondary | ICD-10-CM | POA: Diagnosis not present

## 2022-05-29 DIAGNOSIS — Z7409 Other reduced mobility: Secondary | ICD-10-CM | POA: Diagnosis not present

## 2022-05-29 DIAGNOSIS — Z789 Other specified health status: Secondary | ICD-10-CM | POA: Diagnosis not present

## 2022-05-31 DIAGNOSIS — I10 Essential (primary) hypertension: Secondary | ICD-10-CM | POA: Diagnosis not present

## 2022-05-31 DIAGNOSIS — Z951 Presence of aortocoronary bypass graft: Secondary | ICD-10-CM | POA: Diagnosis not present

## 2022-06-04 DIAGNOSIS — M25611 Stiffness of right shoulder, not elsewhere classified: Secondary | ICD-10-CM | POA: Diagnosis not present

## 2022-06-04 DIAGNOSIS — I2510 Atherosclerotic heart disease of native coronary artery without angina pectoris: Secondary | ICD-10-CM | POA: Diagnosis not present

## 2022-06-04 DIAGNOSIS — E782 Mixed hyperlipidemia: Secondary | ICD-10-CM | POA: Diagnosis not present

## 2022-06-04 DIAGNOSIS — I63512 Cerebral infarction due to unspecified occlusion or stenosis of left middle cerebral artery: Secondary | ICD-10-CM | POA: Diagnosis not present

## 2022-06-04 DIAGNOSIS — Z789 Other specified health status: Secondary | ICD-10-CM | POA: Diagnosis not present

## 2022-06-04 DIAGNOSIS — R4701 Aphasia: Secondary | ICD-10-CM | POA: Diagnosis not present

## 2022-06-04 DIAGNOSIS — I6523 Occlusion and stenosis of bilateral carotid arteries: Secondary | ICD-10-CM | POA: Diagnosis not present

## 2022-06-04 DIAGNOSIS — I69320 Aphasia following cerebral infarction: Secondary | ICD-10-CM | POA: Diagnosis not present

## 2022-06-04 DIAGNOSIS — Z7409 Other reduced mobility: Secondary | ICD-10-CM | POA: Diagnosis not present

## 2022-06-04 DIAGNOSIS — G8191 Hemiplegia, unspecified affecting right dominant side: Secondary | ICD-10-CM | POA: Diagnosis not present

## 2022-06-04 DIAGNOSIS — R2689 Other abnormalities of gait and mobility: Secondary | ICD-10-CM | POA: Diagnosis not present

## 2022-06-04 DIAGNOSIS — I639 Cerebral infarction, unspecified: Secondary | ICD-10-CM | POA: Diagnosis not present

## 2022-06-04 DIAGNOSIS — I10 Essential (primary) hypertension: Secondary | ICD-10-CM | POA: Diagnosis not present

## 2022-06-04 DIAGNOSIS — Z955 Presence of coronary angioplasty implant and graft: Secondary | ICD-10-CM | POA: Diagnosis not present

## 2022-06-06 DIAGNOSIS — G8191 Hemiplegia, unspecified affecting right dominant side: Secondary | ICD-10-CM | POA: Diagnosis not present

## 2022-06-06 DIAGNOSIS — M25611 Stiffness of right shoulder, not elsewhere classified: Secondary | ICD-10-CM | POA: Diagnosis not present

## 2022-06-06 DIAGNOSIS — R2689 Other abnormalities of gait and mobility: Secondary | ICD-10-CM | POA: Diagnosis not present

## 2022-06-06 DIAGNOSIS — I6523 Occlusion and stenosis of bilateral carotid arteries: Secondary | ICD-10-CM | POA: Diagnosis not present

## 2022-06-06 DIAGNOSIS — I69320 Aphasia following cerebral infarction: Secondary | ICD-10-CM | POA: Diagnosis not present

## 2022-06-06 DIAGNOSIS — I639 Cerebral infarction, unspecified: Secondary | ICD-10-CM | POA: Diagnosis not present

## 2022-06-06 DIAGNOSIS — I2510 Atherosclerotic heart disease of native coronary artery without angina pectoris: Secondary | ICD-10-CM | POA: Diagnosis not present

## 2022-06-06 DIAGNOSIS — I10 Essential (primary) hypertension: Secondary | ICD-10-CM | POA: Diagnosis not present

## 2022-06-06 DIAGNOSIS — I63512 Cerebral infarction due to unspecified occlusion or stenosis of left middle cerebral artery: Secondary | ICD-10-CM | POA: Diagnosis not present

## 2022-06-06 DIAGNOSIS — Z7409 Other reduced mobility: Secondary | ICD-10-CM | POA: Diagnosis not present

## 2022-06-06 DIAGNOSIS — E782 Mixed hyperlipidemia: Secondary | ICD-10-CM | POA: Diagnosis not present

## 2022-06-06 DIAGNOSIS — Z789 Other specified health status: Secondary | ICD-10-CM | POA: Diagnosis not present

## 2022-06-10 DIAGNOSIS — I2510 Atherosclerotic heart disease of native coronary artery without angina pectoris: Secondary | ICD-10-CM | POA: Diagnosis not present

## 2022-06-10 DIAGNOSIS — G8191 Hemiplegia, unspecified affecting right dominant side: Secondary | ICD-10-CM | POA: Diagnosis not present

## 2022-06-10 DIAGNOSIS — E782 Mixed hyperlipidemia: Secondary | ICD-10-CM | POA: Diagnosis not present

## 2022-06-10 DIAGNOSIS — Z789 Other specified health status: Secondary | ICD-10-CM | POA: Diagnosis not present

## 2022-06-10 DIAGNOSIS — I10 Essential (primary) hypertension: Secondary | ICD-10-CM | POA: Diagnosis not present

## 2022-06-10 DIAGNOSIS — I69320 Aphasia following cerebral infarction: Secondary | ICD-10-CM | POA: Diagnosis not present

## 2022-06-10 DIAGNOSIS — I63512 Cerebral infarction due to unspecified occlusion or stenosis of left middle cerebral artery: Secondary | ICD-10-CM | POA: Diagnosis not present

## 2022-06-10 DIAGNOSIS — R2689 Other abnormalities of gait and mobility: Secondary | ICD-10-CM | POA: Diagnosis not present

## 2022-06-10 DIAGNOSIS — M25611 Stiffness of right shoulder, not elsewhere classified: Secondary | ICD-10-CM | POA: Diagnosis not present

## 2022-06-10 DIAGNOSIS — I639 Cerebral infarction, unspecified: Secondary | ICD-10-CM | POA: Diagnosis not present

## 2022-06-10 DIAGNOSIS — I6523 Occlusion and stenosis of bilateral carotid arteries: Secondary | ICD-10-CM | POA: Diagnosis not present

## 2022-06-10 DIAGNOSIS — Z7409 Other reduced mobility: Secondary | ICD-10-CM | POA: Diagnosis not present

## 2022-06-12 DIAGNOSIS — I69320 Aphasia following cerebral infarction: Secondary | ICD-10-CM | POA: Diagnosis not present

## 2022-06-12 DIAGNOSIS — R2689 Other abnormalities of gait and mobility: Secondary | ICD-10-CM | POA: Diagnosis not present

## 2022-06-12 DIAGNOSIS — I10 Essential (primary) hypertension: Secondary | ICD-10-CM | POA: Diagnosis not present

## 2022-06-12 DIAGNOSIS — I639 Cerebral infarction, unspecified: Secondary | ICD-10-CM | POA: Diagnosis not present

## 2022-06-12 DIAGNOSIS — I2510 Atherosclerotic heart disease of native coronary artery without angina pectoris: Secondary | ICD-10-CM | POA: Diagnosis not present

## 2022-06-12 DIAGNOSIS — E782 Mixed hyperlipidemia: Secondary | ICD-10-CM | POA: Diagnosis not present

## 2022-06-12 DIAGNOSIS — Z789 Other specified health status: Secondary | ICD-10-CM | POA: Diagnosis not present

## 2022-06-12 DIAGNOSIS — M25611 Stiffness of right shoulder, not elsewhere classified: Secondary | ICD-10-CM | POA: Diagnosis not present

## 2022-06-12 DIAGNOSIS — I6523 Occlusion and stenosis of bilateral carotid arteries: Secondary | ICD-10-CM | POA: Diagnosis not present

## 2022-06-12 DIAGNOSIS — Z7409 Other reduced mobility: Secondary | ICD-10-CM | POA: Diagnosis not present

## 2022-06-12 DIAGNOSIS — G8191 Hemiplegia, unspecified affecting right dominant side: Secondary | ICD-10-CM | POA: Diagnosis not present

## 2022-06-12 DIAGNOSIS — I63512 Cerebral infarction due to unspecified occlusion or stenosis of left middle cerebral artery: Secondary | ICD-10-CM | POA: Diagnosis not present

## 2022-06-13 DIAGNOSIS — I639 Cerebral infarction, unspecified: Secondary | ICD-10-CM | POA: Diagnosis not present

## 2022-06-13 DIAGNOSIS — I2510 Atherosclerotic heart disease of native coronary artery without angina pectoris: Secondary | ICD-10-CM | POA: Diagnosis not present

## 2022-06-13 DIAGNOSIS — G8191 Hemiplegia, unspecified affecting right dominant side: Secondary | ICD-10-CM | POA: Diagnosis not present

## 2022-06-13 DIAGNOSIS — Z7409 Other reduced mobility: Secondary | ICD-10-CM | POA: Diagnosis not present

## 2022-06-13 DIAGNOSIS — E782 Mixed hyperlipidemia: Secondary | ICD-10-CM | POA: Diagnosis not present

## 2022-06-13 DIAGNOSIS — Z955 Presence of coronary angioplasty implant and graft: Secondary | ICD-10-CM | POA: Diagnosis not present

## 2022-06-13 DIAGNOSIS — I10 Essential (primary) hypertension: Secondary | ICD-10-CM | POA: Diagnosis not present

## 2022-06-13 DIAGNOSIS — Z789 Other specified health status: Secondary | ICD-10-CM | POA: Diagnosis not present

## 2022-06-13 DIAGNOSIS — R4701 Aphasia: Secondary | ICD-10-CM | POA: Diagnosis not present

## 2022-06-17 DIAGNOSIS — I639 Cerebral infarction, unspecified: Secondary | ICD-10-CM | POA: Diagnosis not present

## 2022-06-17 DIAGNOSIS — E782 Mixed hyperlipidemia: Secondary | ICD-10-CM | POA: Diagnosis not present

## 2022-06-17 DIAGNOSIS — M25611 Stiffness of right shoulder, not elsewhere classified: Secondary | ICD-10-CM | POA: Diagnosis not present

## 2022-06-17 DIAGNOSIS — R2689 Other abnormalities of gait and mobility: Secondary | ICD-10-CM | POA: Diagnosis not present

## 2022-06-17 DIAGNOSIS — I6523 Occlusion and stenosis of bilateral carotid arteries: Secondary | ICD-10-CM | POA: Diagnosis not present

## 2022-06-17 DIAGNOSIS — I2510 Atherosclerotic heart disease of native coronary artery without angina pectoris: Secondary | ICD-10-CM | POA: Diagnosis not present

## 2022-06-17 DIAGNOSIS — Z7409 Other reduced mobility: Secondary | ICD-10-CM | POA: Diagnosis not present

## 2022-06-17 DIAGNOSIS — G8191 Hemiplegia, unspecified affecting right dominant side: Secondary | ICD-10-CM | POA: Diagnosis not present

## 2022-06-17 DIAGNOSIS — I69320 Aphasia following cerebral infarction: Secondary | ICD-10-CM | POA: Diagnosis not present

## 2022-06-17 DIAGNOSIS — I10 Essential (primary) hypertension: Secondary | ICD-10-CM | POA: Diagnosis not present

## 2022-06-17 DIAGNOSIS — Z789 Other specified health status: Secondary | ICD-10-CM | POA: Diagnosis not present

## 2022-06-17 DIAGNOSIS — I63512 Cerebral infarction due to unspecified occlusion or stenosis of left middle cerebral artery: Secondary | ICD-10-CM | POA: Diagnosis not present

## 2022-06-19 DIAGNOSIS — Z7409 Other reduced mobility: Secondary | ICD-10-CM | POA: Diagnosis not present

## 2022-06-19 DIAGNOSIS — Z789 Other specified health status: Secondary | ICD-10-CM | POA: Diagnosis not present

## 2022-06-24 DIAGNOSIS — Z7409 Other reduced mobility: Secondary | ICD-10-CM | POA: Diagnosis not present

## 2022-06-24 DIAGNOSIS — Z789 Other specified health status: Secondary | ICD-10-CM | POA: Diagnosis not present

## 2022-06-26 DIAGNOSIS — Z789 Other specified health status: Secondary | ICD-10-CM | POA: Diagnosis not present

## 2022-06-26 DIAGNOSIS — Z7409 Other reduced mobility: Secondary | ICD-10-CM | POA: Diagnosis not present

## 2022-07-01 DIAGNOSIS — Z789 Other specified health status: Secondary | ICD-10-CM | POA: Diagnosis not present

## 2022-07-01 DIAGNOSIS — Z7409 Other reduced mobility: Secondary | ICD-10-CM | POA: Diagnosis not present

## 2022-07-03 DIAGNOSIS — Z7409 Other reduced mobility: Secondary | ICD-10-CM | POA: Diagnosis not present

## 2022-07-03 DIAGNOSIS — Z789 Other specified health status: Secondary | ICD-10-CM | POA: Diagnosis not present

## 2022-07-08 DIAGNOSIS — Z789 Other specified health status: Secondary | ICD-10-CM | POA: Diagnosis not present

## 2022-07-08 DIAGNOSIS — Z7409 Other reduced mobility: Secondary | ICD-10-CM | POA: Diagnosis not present

## 2022-07-10 DIAGNOSIS — Z7409 Other reduced mobility: Secondary | ICD-10-CM | POA: Diagnosis not present

## 2022-07-10 DIAGNOSIS — Z789 Other specified health status: Secondary | ICD-10-CM | POA: Diagnosis not present

## 2022-07-15 DIAGNOSIS — Z789 Other specified health status: Secondary | ICD-10-CM | POA: Diagnosis not present

## 2022-07-15 DIAGNOSIS — Z7409 Other reduced mobility: Secondary | ICD-10-CM | POA: Diagnosis not present

## 2022-08-11 DIAGNOSIS — J209 Acute bronchitis, unspecified: Secondary | ICD-10-CM | POA: Diagnosis not present

## 2022-08-11 DIAGNOSIS — R051 Acute cough: Secondary | ICD-10-CM | POA: Diagnosis not present

## 2022-08-11 DIAGNOSIS — R059 Cough, unspecified: Secondary | ICD-10-CM | POA: Diagnosis not present

## 2022-08-11 DIAGNOSIS — Z20822 Contact with and (suspected) exposure to covid-19: Secondary | ICD-10-CM | POA: Diagnosis not present

## 2022-08-18 DIAGNOSIS — L237 Allergic contact dermatitis due to plants, except food: Secondary | ICD-10-CM | POA: Diagnosis not present

## 2022-08-18 DIAGNOSIS — L089 Local infection of the skin and subcutaneous tissue, unspecified: Secondary | ICD-10-CM | POA: Diagnosis not present

## 2022-08-20 DIAGNOSIS — L237 Allergic contact dermatitis due to plants, except food: Secondary | ICD-10-CM | POA: Diagnosis not present

## 2022-08-22 DIAGNOSIS — I255 Ischemic cardiomyopathy: Secondary | ICD-10-CM | POA: Diagnosis not present

## 2022-08-22 DIAGNOSIS — I10 Essential (primary) hypertension: Secondary | ICD-10-CM | POA: Diagnosis not present

## 2022-08-22 DIAGNOSIS — Z8673 Personal history of transient ischemic attack (TIA), and cerebral infarction without residual deficits: Secondary | ICD-10-CM | POA: Diagnosis not present

## 2022-08-22 DIAGNOSIS — E782 Mixed hyperlipidemia: Secondary | ICD-10-CM | POA: Diagnosis not present

## 2022-08-22 DIAGNOSIS — I2510 Atherosclerotic heart disease of native coronary artery without angina pectoris: Secondary | ICD-10-CM | POA: Diagnosis not present

## 2022-09-12 DIAGNOSIS — I2510 Atherosclerotic heart disease of native coronary artery without angina pectoris: Secondary | ICD-10-CM | POA: Diagnosis not present

## 2022-09-12 DIAGNOSIS — I255 Ischemic cardiomyopathy: Secondary | ICD-10-CM | POA: Diagnosis not present

## 2022-09-12 DIAGNOSIS — I6389 Other cerebral infarction: Secondary | ICD-10-CM | POA: Diagnosis not present

## 2023-01-05 IMAGING — DX DG ABDOMEN 1V
1 series · 1 of 1 positions shown · non-contrast
Comparison: None.

CLINICAL DATA: Nasoenteric feeding tube placement

EXAM:
ABDOMEN - 1 VIEW

[abdomen supine]
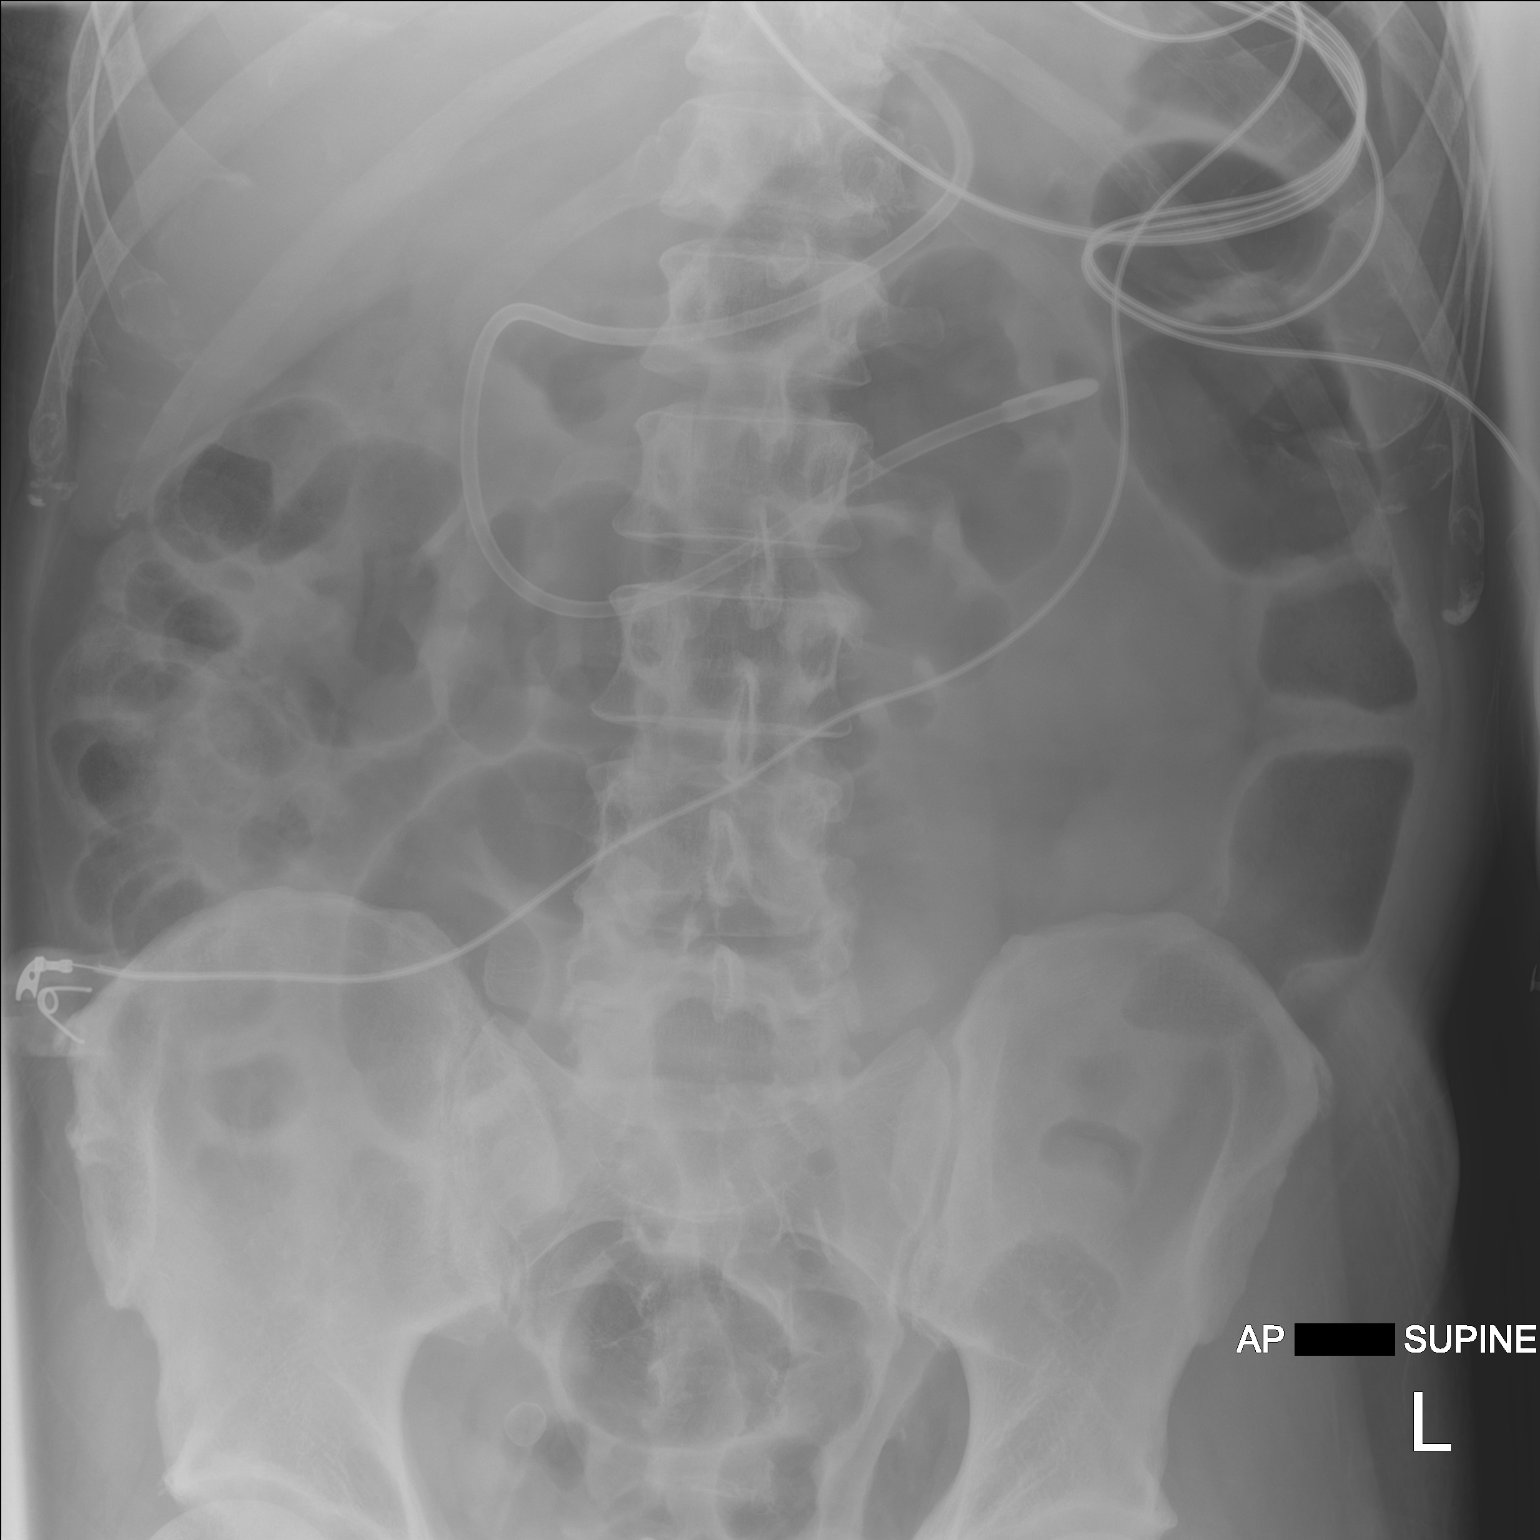

[1 of 1 positions shown; findings below may reference images not displayed]

FINDINGS: Nasoenteric feeding tube tip is seen within the expected location of
the ligament of Treitz. Normal abdominal gas pattern. No gross free
intraperitoneal gas.
IMPRESSION: Nasoenteric feeding tube tip at the level of the ligament of Treitz.

## 2023-01-22 IMAGING — DX DG ABDOMEN 1V
1 series · 1 of 1 positions shown · non-contrast
Comparison: 03/14/2022

CLINICAL DATA: Feeding tube placement.

EXAM:
ABDOMEN - 1 VIEW

[abdomen kub]
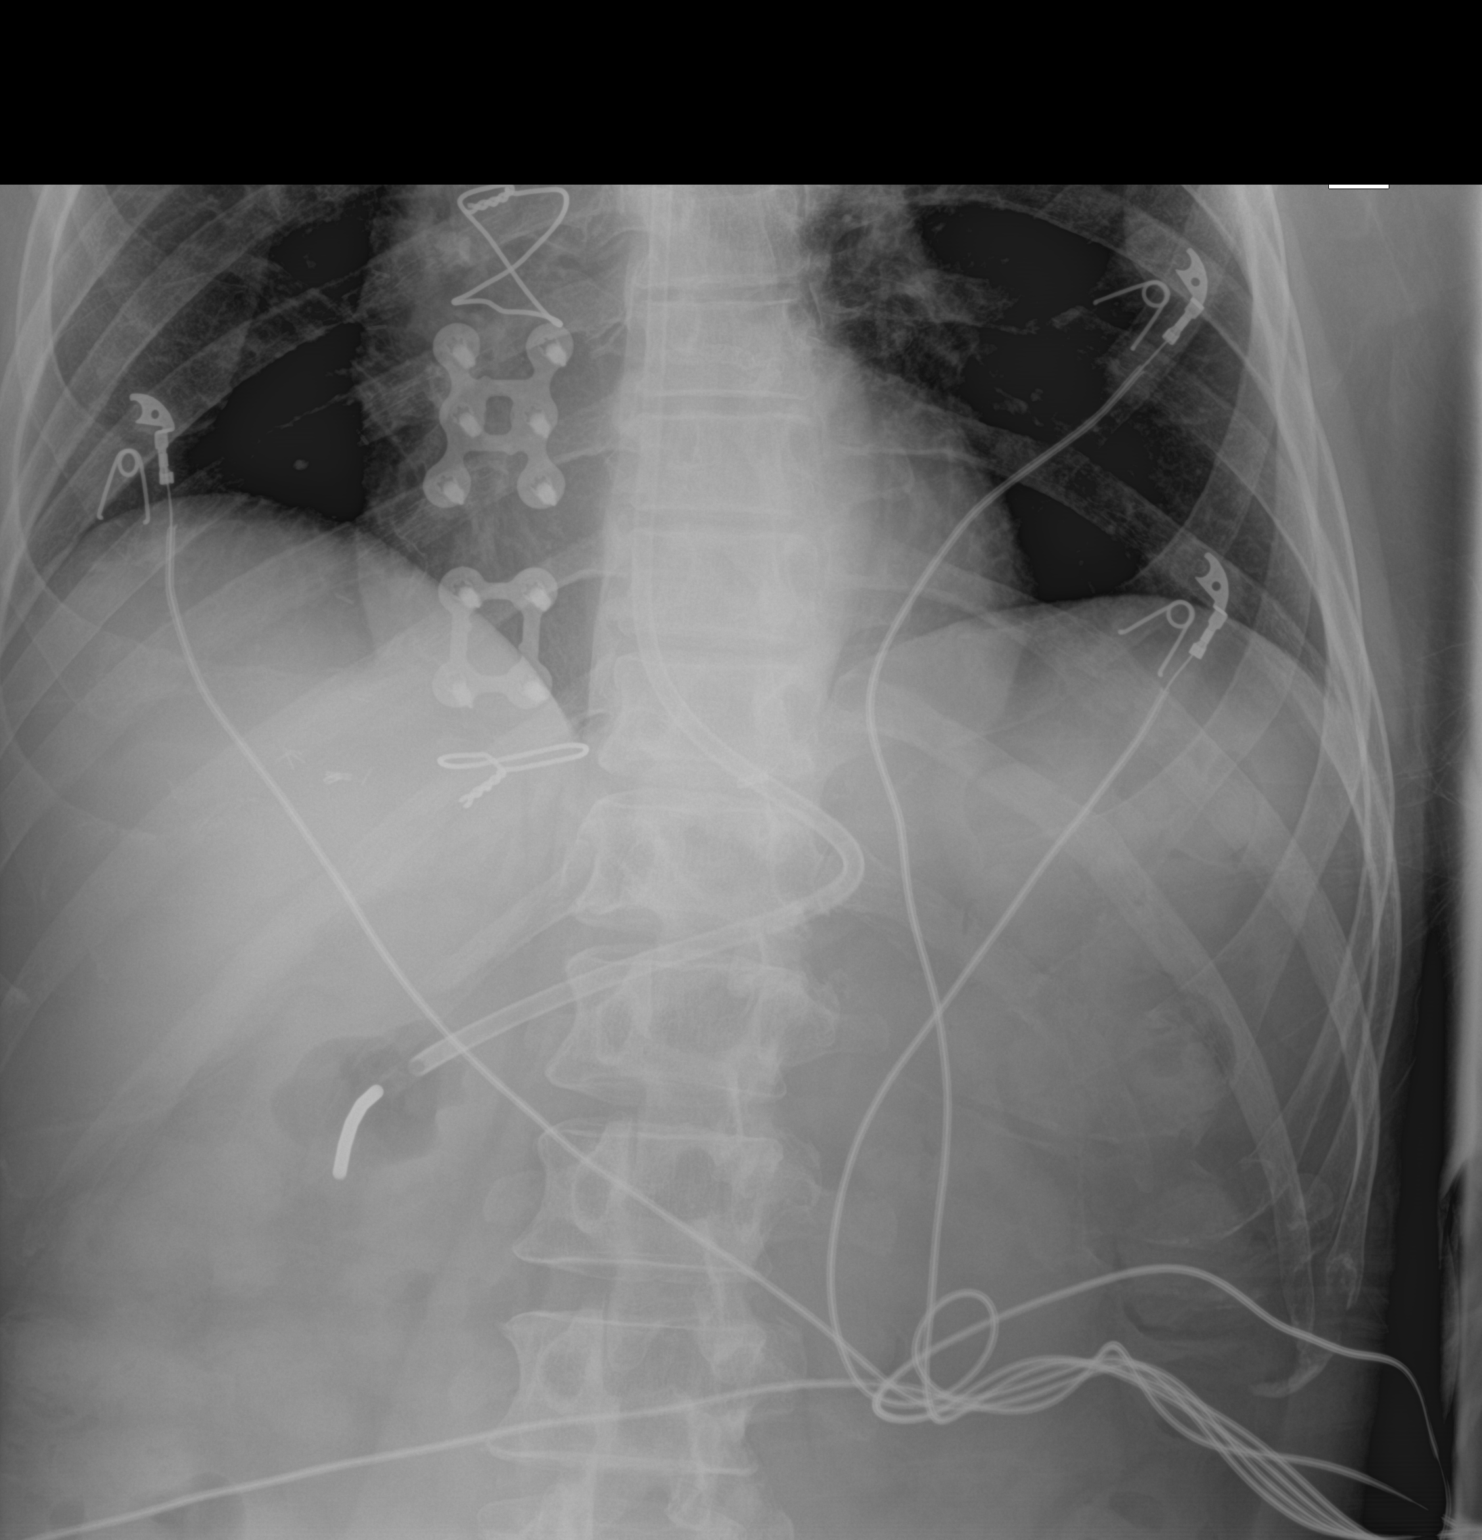

[1 of 1 positions shown; findings below may reference images not displayed]

FINDINGS: The feeding tube tip is in the region of the duodenal bulb.
IMPRESSION: Feeding tube tip is in the duodenal bulb.

## 2023-03-22 ENCOUNTER — Encounter: Payer: Self-pay | Admitting: *Deleted

## 2023-03-22 ENCOUNTER — Ambulatory Visit
Admission: EM | Admit: 2023-03-22 | Discharge: 2023-03-22 | Disposition: A | Discharge status: Home / Self Care | Payer: BC Managed Care – PPO | Attending: Family Medicine | Admitting: Family Medicine

## 2023-03-22 ENCOUNTER — Ambulatory Visit (INDEPENDENT_AMBULATORY_CARE_PROVIDER_SITE_OTHER): Payer: BC Managed Care – PPO

## 2023-03-22 ENCOUNTER — Other Ambulatory Visit: Payer: Self-pay

## 2023-03-22 DIAGNOSIS — R0602 Shortness of breath: Secondary | ICD-10-CM | POA: Diagnosis not present

## 2023-03-22 DIAGNOSIS — R059 Cough, unspecified: Secondary | ICD-10-CM

## 2023-03-22 DIAGNOSIS — J309 Allergic rhinitis, unspecified: Secondary | ICD-10-CM

## 2023-03-22 HISTORY — DX: Cerebral infarction, unspecified: I63.9

## 2023-03-22 MED ORDER — FEXOFENADINE HCL 180 MG PO TABS: 180.0000 mg | ORAL_TABLET | Freq: Every day | ORAL | 0 refills | Status: AC

## 2023-03-22 MED ORDER — PREDNISONE 10 MG (21) PO TBPK
ORAL_TABLET | Freq: Every day | ORAL | 0 refills | Status: AC
Start: 2023-03-22 — End: ?

## 2023-03-22 MED ORDER — DOXYCYCLINE HYCLATE 100 MG PO CAPS: 100.0000 mg | ORAL_CAPSULE | Freq: Two times a day (BID) | ORAL | 0 refills | Status: AC

## 2023-03-22 NOTE — ED Provider Notes (Signed)
[facility]    CSN: [id number] Arrival date & time: 03/22/23  1504      History   Chief Complaint Chief Complaint  Patient presents with   Shortness of Breath   Cough    HPI Patrick Dunn is a 62 y.o. male.   HPI Very pleasant 62 year old male presents with reports shortness of breath and cough for 1 week.  Significant for CAD, acute CVA due to ischemia, acute chronic respiratory failure with hypoxia.  Patient is accompanied by his wife this afternoon.  Patient has history of expressive aphasia as well.  Past Medical History:  Diagnosis Date   CVA (cerebral vascular accident) [facility])     Patient Active Problem List   Diagnosis Date Noted   Acute on chronic respiratory failure with hypoxia (HCC) 03/16/2022   CAD (coronary artery disease), native coronary artery 03/16/2022   Acute stroke due to ischemia (HCC) 03/16/2022   Hospital-acquired bacterial pneumonia 03/16/2022   Chronic obstructive pulmonary disease (HCC) 03/16/2022   LARYNGOTRACHEOBRONCHITIS, ACUTE 07/10/2011   ALLERGIC RHINITIS 01/02/2010   ASTHMA 01/02/2010    Past Surgical History:  Procedure Laterality Date   CORONARY ARTERY BYPASS GRAFT         Home Medications    Prior to Admission medications   Medication Sig Start Date End Date Taking? Authorizing Provider  doxycycline (VIBRAMYCIN) 100 MG capsule Take 1 capsule (100 mg total) by mouth 2 (two) times daily for 10 days. 03/22/23 04/01/23 Yes Trevor Iha, FNP  fexofenadine [facility] ALLERGY) 180 MG tablet Take 1 tablet (180 mg total) by mouth daily for 15 days. 03/22/23 04/06/23 Yes Trevor Iha, FNP  predniSONE (STERAPRED UNI-PAK 21 TAB) 10 MG (21) TBPK tablet Take by mouth daily. Take 6 tabs by mouth daily  for 2 days, then 5 tabs for 2 days, then 4 tabs for 2 days, then 3 tabs for 2 days, 2 tabs for 2 days, then 1 tab by mouth daily for 2 days 03/22/23  Yes Trevor Iha, FNP    Family History History reviewed. No pertinent family  history.  Social History Social History   Tobacco Use   Smoking status: Never   Smokeless tobacco: Never     Allergies   Patient has no known allergies.   Review of Systems Review of Systems   Physical Exam Triage Vital Signs ED Triage Vitals  Enc Vitals Group     BP 03/22/23 1525 100/70     Pulse Rate 03/22/23 1525 80     Resp 03/22/23 1525 20     Temp 03/22/23 1525 98.2 F (36.8 C)     Temp src --      SpO2 03/22/23 1525 97 %     Weight --      Height --      Head Circumference --      Peak Flow --      Pain Score 03/22/23 1526 0     Pain Loc --      Pain Edu? --      Excl. in GC? --    No data found.  Updated Vital Signs BP (!) 157/88   Pulse 60   Temp 98.2 F (36.8 C)   Resp 20   SpO2 98%   Visual Acuity Right Eye Distance:   Left Eye Distance:   Bilateral Distance:    Right Eye Near:   Left Eye Near:    Bilateral Near:     Physical Exam Vitals and nursing note  reviewed.  Constitutional:      Appearance: Normal appearance. He is normal weight. He is ill-appearing.  HENT:     Head: Normocephalic and atraumatic.     Right Ear: Tympanic membrane, ear canal and external ear normal.     Left Ear: Tympanic membrane, ear canal and external ear normal.     Nose: Nose normal.     Mouth/Throat:     Mouth: Mucous membranes are moist.     Pharynx: Oropharynx is clear.     Comments: Significant amount of clear drainage of posterior oropharynx noted Eyes:     Extraocular Movements: Extraocular movements intact.     Conjunctiva/sclera: Conjunctivae normal.     Pupils: Pupils are equal, round, and reactive to light.  Cardiovascular:     Rate and Rhythm: Normal rate and regular rhythm.     Pulses: Normal pulses.     Heart sounds: Normal heart sounds.  Pulmonary:     Effort: Pulmonary effort is normal.     Breath sounds: Normal breath sounds. No wheezing, rhonchi or rales.     Comments: Inrequent nonproductive cough noted Musculoskeletal:         General: Normal range of motion.     Cervical back: Normal range of motion and neck supple.  Skin:    General: Skin is warm and dry.  Neurological:     General: No focal deficit present.     Mental Status: He is alert and oriented to person, place, and time. Mental status is at baseline.     Cranial Nerves: No cranial nerve deficit.      UC Treatments / Results  Labs (all labs ordered are listed, but only abnormal results are displayed) Labs Reviewed - No data to display  EKG   Radiology DG Chest 2 View  Result Date: 03/22/2023 CLINICAL DATA:  Cough and shortness of breath for 1 week. EXAM: CHEST - 2 VIEW COMPARISON:  08/11/2022. FINDINGS: Cardiac silhouette normal in size. Left coronary artery stent. Previous median sternotomy. No mediastinal or hilar masses or evidence of adenopathy. Clear lungs.  No pleural effusion or pneumothorax. Skeletal structures are intact. IMPRESSION: No active cardiopulmonary disease. Electronically Signed   By: Amie Portland M.D.   On: 03/22/2023 15:48    Procedures Procedures (including critical care time)  Medications Ordered in UC Medications - No data to display  Initial Impression / Assessment and Plan / UC Course  I have reviewed the triage vital signs and the nursing notes.  Pertinent labs & imaging results that were available during my care of the patient were reviewed by me and considered in my medical decision making (see chart for details).     DM: 1.  Cough, unspecified type-CXR revealed above, Rx'd Doxycycline 100 mg twice daily x 10 days, Rx Sterapred Unipak (tapering from 60 mg to 10 mg over 10 days; 2.  Allergic rhinitis, unspecified seasonality, unspecified trigger-Rx'd Allegra 180 mg daily x 5 days, then as needed. Instructed patient/wife to take medications as directed with food to completion.  Advised to take Allegra and prednisone with first dose of doxycycline for the next 10 days.  Advised may stop Allegra after 5 days if  concurrent postnasal drainage/drip has resolved.  Encouraged increase daily water intake to 64 ounces per day while taking these medications.  Advised if symptoms worsen and/or unresolved please follow-up with PCP or here for further evaluation.  Patient discharged home, hemodynamically stable. Final Clinical Impressions(s) / UC Diagnoses   Final  diagnoses:  Cough, unspecified type  Allergic rhinitis, unspecified seasonality, unspecified trigger     Discharge Instructions      Instructed patient/wife to take medications as directed with food to completion.  Advised to take Allegra and Prednisone with first dose of Doxycycline for the next 10 days.  Advised may stop Allegra after 5 days if concurrent postnasal drainage/drip has resolved.  Encouraged increase daily water intake to 64 ounces per day while taking these medications.  Advised if symptoms worsen and/or unresolved please follow-up with PCP or here for further evaluation.     ED Prescriptions     Medication Sig Dispense Auth. Provider   doxycycline (VIBRAMYCIN) 100 MG capsule Take 1 capsule (100 mg total) by mouth 2 (two) times daily for 10 days. 20 capsule Trevor Iha, FNP   predniSONE (STERAPRED UNI-PAK 21 TAB) 10 MG (21) TBPK tablet Take by mouth daily. Take 6 tabs by mouth daily  for 2 days, then 5 tabs for 2 days, then 4 tabs for 2 days, then 3 tabs for 2 days, 2 tabs for 2 days, then 1 tab by mouth daily for 2 days 42 tablet Trevor Iha, FNP   fexofenadine [facility] ALLERGY) 180 MG tablet Take 1 tablet (180 mg total) by mouth daily for 15 days. 15 tablet Trevor Iha, FNP      PDMP not reviewed this encounter.   Trevor Iha, FNP 03/22/23 1645

## 2023-03-22 NOTE — ED Triage Notes (Signed)
Pt reports he has had SHOB and cough for one week. Pt has been using his Albuterol inhaler with out improvement. Pt reports SHOB at rest.

## 2023-03-22 NOTE — Discharge Instructions (Addendum)
Instructed patient/wife to take medications as directed with food to completion.  Advised to take Allegra and Prednisone with first dose of Doxycycline for the next 10 days.  Advised may stop Allegra after 5 days if concurrent postnasal drainage/drip has resolved.  Encouraged increase daily water intake to 64 ounces per day while taking these medications.  Advised if symptoms worsen and/or unresolved please follow-up with PCP or here for further evaluation.
